# Patient Record
Sex: Male | Born: 1990
Health system: Southern US, Community
[De-identification: ages and names within clinical notes are randomized; demographics above are authoritative.]

## PROBLEM LIST (undated history)

## (undated) DIAGNOSIS — R55 Syncope and collapse: Secondary | ICD-10-CM

## (undated) DIAGNOSIS — D72819 Decreased white blood cell count, unspecified: Secondary | ICD-10-CM

## (undated) DIAGNOSIS — Z91018 Allergy to other foods: Secondary | ICD-10-CM

## (undated) DIAGNOSIS — G4700 Insomnia, unspecified: Secondary | ICD-10-CM

## (undated) DIAGNOSIS — K219 Gastro-esophageal reflux disease without esophagitis: Secondary | ICD-10-CM

## (undated) DIAGNOSIS — R790 Abnormal level of blood mineral: Secondary | ICD-10-CM

## (undated) HISTORY — DX: Allergy to other foods: Z91.018

## (undated) HISTORY — DX: Syncope and collapse: R55

## (undated) HISTORY — DX: Gastro-esophageal reflux disease without esophagitis: K21.9

## (undated) HISTORY — DX: Decreased white blood cell count, unspecified: D72.819

## (undated) HISTORY — PX: LIPOMA EXCISION: SHX5283

## (undated) HISTORY — PX: MOLE REMOVAL: SHX2046

## (undated) HISTORY — DX: Insomnia, unspecified: G47.00

## (undated) HISTORY — DX: Abnormal level of blood mineral: R79.0

---

## 2019-01-22 ENCOUNTER — Ambulatory Visit (INDEPENDENT_AMBULATORY_CARE_PROVIDER_SITE_OTHER): Payer: No Typology Code available for payment source | Admitting: Family Medicine

## 2019-01-22 ENCOUNTER — Other Ambulatory Visit: Payer: Self-pay

## 2019-01-22 ENCOUNTER — Encounter: Payer: Self-pay | Admitting: Family Medicine

## 2019-01-22 VITALS — BP 110/84 | HR 71 | Temp 98.6°F | Ht 72.0 in | Wt 158.2 lb

## 2019-01-22 DIAGNOSIS — L723 Sebaceous cyst: Secondary | ICD-10-CM

## 2019-01-22 DIAGNOSIS — R21 Rash and other nonspecific skin eruption: Secondary | ICD-10-CM | POA: Diagnosis not present

## 2019-01-22 DIAGNOSIS — D225 Melanocytic nevi of trunk: Secondary | ICD-10-CM | POA: Diagnosis not present

## 2019-01-22 DIAGNOSIS — D229 Melanocytic nevi, unspecified: Secondary | ICD-10-CM

## 2019-01-22 NOTE — Assessment & Plan Note (Signed)
Does not currently have any rash.  Possibly could be psoriasis based on his description.  Will place referral to dermatology.

## 2019-01-22 NOTE — Assessment & Plan Note (Signed)
Punch biopsy performed today.  He tolerated well-see below problem.  Will place referral to dermatology.

## 2019-01-22 NOTE — Patient Instructions (Signed)
It was very nice to see you today!  We biopsied your mole today.  I will place a referral to dermatology.  We will call you with the results once they are available.   Take care, Dr [name]   Skin Biopsy, Care After This sheet gives you information about how to care for yourself after your procedure. Your health care provider may also give you more specific instructions. If you have problems or questions, contact your health care provider. What can I expect after the procedure? After the procedure, it is common to have:  Soreness.  Bruising.  Itching. Follow these instructions at home: Biopsy site care Follow instructions from your health care provider about how to take care of your biopsy site. Make sure you:  Wash your hands with soap and water before and after you change your bandage (dressing). If soap and water are not available, use hand sanitizer.  Apply ointment on your biopsy site as directed by your health care provider.  Change your dressing as told by your health care provider.  Leave stitches (sutures), skin glue, or adhesive strips in place. These skin closures may need to stay in place for 2 weeks or longer. If adhesive strip edges start to loosen and curl up, you may trim the loose edges. Do not remove adhesive strips completely unless your health care provider tells you to do that.  If the biopsy area bleeds, apply gentle pressure for 10 minutes. Check your biopsy site every day for signs of infection. Check for:  Redness, swelling, or pain.  Fluid or blood.  Warmth.  Pus or a bad smell.  General instructions  Rest and then return to your normal activities as told by your health care provider.  Take over-the-counter and prescription medicines only as told by your health care provider.  Keep all follow-up visits as told by your health care provider. This is important. Contact a health care provider if:  You have redness, swelling, or pain around  your biopsy site.  You have fluid or blood coming from your biopsy site.  Your biopsy site feels warm to the touch.  You have pus or a bad smell coming from your biopsy site.  You have a fever.  Your sutures, skin glue, or adhesive strips loosen or come off sooner than expected. Get help right away if:  You have bleeding that does not stop with pressure or a dressing. Summary  After the procedure, it is common to have soreness, bruising, and itching at the site.  Follow instructions from your health care provider about how to take care of your biopsy site.  Check your biopsy site every day for signs of infection.  Contact a health care provider if you have redness, swelling, or pain around your biopsy site, or your biopsy site feels warm to the touch.  Keep all follow-up visits as told by your health care provider. This is important. This information is not intended to replace advice given to you by your health care provider. Make sure you discuss any questions you have with your health care provider. Document Released: 08/18/2015 Document Revised: 01/19/2018 Document Reviewed: 01/19/2018 Elsevier Interactive Patient Education  [employer].

## 2019-01-22 NOTE — Progress Notes (Signed)
Chief Complaint:  Scott Costa is a 28 y.o. male who presents today with a chief complaint of skin lesions and to establish care.   Assessment/Plan:  Sebaceous cyst Will place referral to dermatology per patient request for excision.   Rash Does not currently have any rash.  Possibly could be psoriasis based on his description.  Will place referral to dermatology.  Atypical nevus Punch biopsy performed today.  He tolerated well-see below problem.  Will place referral to dermatology.     Subjective:  HPI:  He has a few chronic problems outlined below:  Atypical Moles Patient with several history of atypical moles.  There is nonspecific mole that he is concerned about on his left flank.  He is concerned because it is irregular in pattern and has irregular color.  No bleeding or drainage from the area.  Overall lesion seems to be stable however he is concerned about possible melanoma.  Rash Also reports intermittent history of erythematous, pruritic rash.  Typically occurs on either side of his flank and then progresses in size.  They were then spontaneously resolved.  Does not currently have any symptoms of this.  Cyst Located on midline upper back.  Occasionally painful.  Occasionally drains sebum.  No fevers.  No chills.  No purulent drainage.  No treatments tried.   ROS: Per HPI, otherwise a complete review of systems was negative.   PMH:  The following were reviewed and entered/updated in epic: Past Medical History:  Diagnosis Date  . Fainting spell    Due to blood draws   Patient Active Problem List   Diagnosis Date Noted  . Atypical nevus 01/22/2019  . Rash 01/22/2019  . Sebaceous cyst 01/22/2019   Past Surgical History:  Procedure Laterality Date  . LIPOMA EXCISION      Family History  Problem Relation Age of Onset  . Hypertension Father   . Colon cancer Paternal Grandfather     Medications- reviewed and updated No current outpatient  medications on file.   No current facility-administered medications for this visit.     Allergies-reviewed and updated Allergies  Allergen Reactions  . Nickel     Social History   Socioeconomic History  . Marital status: Single    Spouse name: Not on file  . Number of children: Not on file  . Years of education: Not on file  . Highest education level: Not on file  Occupational History  . Not on file  Social Needs  . Financial resource strain: Not on file  . Food insecurity    Worry: Not on file    Inability: Not on file  . Transportation needs    Medical: Not on file    Non-medical: Not on file  Tobacco Use  . Smoking status: Former Smoker    Years: 5.00  . Smokeless tobacco: Never Used  Substance and Sexual Activity  . Alcohol use: Never    Frequency: Never  . Drug use: Not on file  . Sexual activity: Not on file  Lifestyle  . Physical activity    Days per week: Not on file    Minutes per session: Not on file  . Stress: Not on file  Relationships  . Social Herbalist on phone: Not on file    Gets together: Not on file    Attends religious service: Not on file    Active member of club or organization: Not on file    Attends meetings  of clubs or organizations: Not on file    Relationship status: Not on file  Other Topics Concern  . Not on file  Social History Narrative  . Not on file          Objective:  Physical Exam: BP 110/84 (BP Location: Left Arm, Patient Position: Sitting, Cuff Size: Normal)   Pulse 71   Temp 98.6 F (37 C) (Oral)   Ht 6' (1.829 m)   Wt 158 lb 3.2 oz (71.8 kg)   SpO2 99%   BMI 21.46 kg/m   Gen: NAD, resting comfortably CV: Regular rate and rhythm with no murmurs appreciated Pulm: Normal work of breathing, clear to auscultation bilaterally with no crackles, wheezes, or rhonchi GI: Normal bowel sounds present. Soft, Nontender, Nondistended. MSK: No edema, cyanosis, or clubbing noted Skin: Freely mobile 1 to 2 cm  mass on midline upper neck.  Several nevi noted scattered across upper extremities and torso.  Atypical appearing nevus on left flank approximately 7 mm x 2 cm with irregular border and irregular color. Neuro: Grossly normal, moves all extremities Psych: Normal affect and thought content  Punch Biopsy Procedure Note:  After informed written consent was obtained, using Betadine for cleansing and 1% Lidocaine with epinephrine for anesthetic, with sterile technique a 4 mm punch biopsy was used to obtain a biopsy specimen of the lesion. Hemostasis was obtained by pressure and wound was not sutured. Antibiotic dressing is applied, and wound care instructions provided. Be alert for any signs of cutaneous infection. The specimen is labeled and sent to pathology for evaluation. The procedure was well tolerated without complications.      Algis Greenhouse. [name], MD 01/22/2019 2:08 PM

## 2019-01-22 NOTE — Assessment & Plan Note (Signed)
Will place referral to dermatology per patient request for excision.

## 2019-01-26 NOTE — Progress Notes (Signed)
Please inform patient of the following:  Biopsy shows a dysplastic nevus with moderate atypia. Would not recommend further excision at this time, however he can follow up with dermatology as we discussed at his OV.  Algis Greenhouse. [name], MD 01/26/2019 4:23 PM

## 2019-01-27 ENCOUNTER — Telehealth: Payer: Self-pay | Admitting: Family Medicine

## 2019-01-27 NOTE — Telephone Encounter (Signed)
Result note read to patient; verbalizes understanding.  Telephone encounter as results were not routed to [facility].

## 2019-04-07 ENCOUNTER — Ambulatory Visit (INDEPENDENT_AMBULATORY_CARE_PROVIDER_SITE_OTHER): Payer: No Typology Code available for payment source | Admitting: Family Medicine

## 2019-04-07 ENCOUNTER — Encounter: Payer: Self-pay | Admitting: Family Medicine

## 2019-04-07 DIAGNOSIS — R05 Cough: Secondary | ICD-10-CM | POA: Diagnosis not present

## 2019-04-07 DIAGNOSIS — R059 Cough, unspecified: Secondary | ICD-10-CM

## 2019-04-07 MED ORDER — PANTOPRAZOLE SODIUM 40 MG PO TBEC: 40.0000 mg | DELAYED_RELEASE_TABLET | Freq: Every day | ORAL | 0 refills | Status: DC

## 2019-04-07 MED ORDER — AZELASTINE HCL 0.1 % NA SOLN: 2.0000 | Freq: Two times a day (BID) | NASAL | 12 refills | Status: DC

## 2019-04-07 MED FILL — AZELASTINE HCL 137 MCG SPRY: 0.1 | 50 days supply | Qty: 30 | Fill #0

## 2019-04-07 MED FILL — PANTOPRAZOLE SOD DR 40 MG T: 40 | 30 days supply | Qty: 30 | Fill #0

## 2019-04-07 NOTE — Progress Notes (Signed)
   Chief Complaint:  Laura Busto is a 28 y.o. male who presents today for a virtual office visit with a chief complaint of cough.   Assessment/Plan:  Cough No red flags.  Likely postinfectious.  It is possibly to be having some postnasal drip related to allergens.  Is also possible he may be having silent reflux.  Will treat with Astelin nasal spray and Protonix for the next week or so.  No signs concerning for atypical pneumonia-we will hold off on chest x-ray for the time being.  If symptoms do not improve with above or symptoms worsen patient will come into the office for chest x-ray.  Discussed reasons return to care earlier.  Follow-up as needed.    Subjective:  HPI:  Cough Started about 5 weeks ago. Cough is nonproductive.  Symptoms have not changed significantly over the last couple of weeks. No fevers or chills. Had a sick contact at work about a month ago.  No fevers or chills.  Associated with some discomfort at the bottom of his throat.  No congestion.  No rhinorrhea.  No loss of taste or smell.  Has been wearing medical mask at work and is not sure if he is having a allergic reaction to the fibers and the mask.  He is worried about possible atypical pneumonia.  No other treatments tried.  No other obvious alleviating or aggravating factors.  ROS: Per HPI  PMH: He reports that he has quit smoking. He quit after 5.00 years of use. He has never used smokeless tobacco. He reports that he does not drink alcohol. No history on file for drug.      Objective/Observations  Physical Exam: Gen: NAD, resting comfortably Pulm: Normal work of breathing Neuro: Grossly normal, moves all extremities Psych: Normal affect and thought content  No results found for this or any previous visit (from the past 24 hour(s)).   Virtual Visit via Video   I connected with Kedus Drouhard on 04/07/19 at 10:00 AM EDT by a video enabled telemedicine application and verified that I am speaking  with the correct person using two identifiers. I discussed the limitations of evaluation and management by telemedicine and the availability of in person appointments. The patient expressed understanding and agreed to proceed.   Patient location: Home Provider location: [place] participating in the virtual visit: Myself and Patient     Algis Greenhouse. [name], MD 04/07/2019 9:52 AM

## 2019-04-17 ENCOUNTER — Encounter: Payer: Self-pay | Admitting: Family Medicine

## 2019-04-27 ENCOUNTER — Other Ambulatory Visit: Payer: Self-pay | Admitting: Family Medicine

## 2019-04-28 MED ORDER — PANTOPRAZOLE SODIUM 40 MG PO TBEC: 40.0000 mg | DELAYED_RELEASE_TABLET | Freq: Every day | ORAL | 0 refills | Status: DC

## 2019-04-29 MED FILL — PANTOPRAZOLE SOD DR 40 MG T: 40 | 30 days supply | Qty: 30 | Fill #0

## 2019-05-08 ENCOUNTER — Encounter: Payer: Self-pay | Admitting: Family Medicine

## 2019-05-10 ENCOUNTER — Other Ambulatory Visit: Payer: Self-pay

## 2019-05-10 DIAGNOSIS — R1310 Dysphagia, unspecified: Secondary | ICD-10-CM

## 2019-05-10 DIAGNOSIS — K219 Gastro-esophageal reflux disease without esophagitis: Secondary | ICD-10-CM

## 2019-05-14 ENCOUNTER — Other Ambulatory Visit: Payer: Self-pay

## 2019-05-14 MED ORDER — PANTOPRAZOLE SODIUM 40 MG PO TBEC: 40.0000 mg | DELAYED_RELEASE_TABLET | Freq: Two times a day (BID) | ORAL | 0 refills | Status: DC

## 2019-05-15 MED FILL — PANTOPRAZOLE SOD DR 40 MG T: 40 | 30 days supply | Qty: 60 | Fill #0

## 2019-06-08 ENCOUNTER — Other Ambulatory Visit: Payer: Self-pay

## 2019-06-08 ENCOUNTER — Ambulatory Visit (INDEPENDENT_AMBULATORY_CARE_PROVIDER_SITE_OTHER): Payer: No Typology Code available for payment source | Admitting: Otolaryngology

## 2019-06-08 ENCOUNTER — Encounter (INDEPENDENT_AMBULATORY_CARE_PROVIDER_SITE_OTHER): Payer: Self-pay | Admitting: Otolaryngology

## 2019-06-08 VITALS — Temp 98.1°F

## 2019-06-08 DIAGNOSIS — R0989 Other specified symptoms and signs involving the circulatory and respiratory systems: Secondary | ICD-10-CM | POA: Diagnosis not present

## 2019-06-08 NOTE — Progress Notes (Signed)
HPI: Scott Costa is a 28 y.o. male who is referred by Dr. [name] for evaluation of throat irritation. Patient states that for the past three months he has dealt with intermittent lower throat irritation. He describes the irritation as a burning and scratchy sensation. It occasionally hurts when he swallows and talks but not all the time. He states it changes day to day on how severe it is. He states he does have some difficulty swallowing at times where he feels like things get stuck in his abdomen and reflux up to his throat area but he has not lost significant weight due to this. He has tried GERD treatment with Pantoprazole and strict diet without benefit. Patient denies nasal congestion, post-nasal drip, hoarseness. Patient is a previous smoker.  Past Medical History:  Diagnosis Date  . Fainting spell    Due to blood draws   Past Surgical History:  Procedure Laterality Date  . LIPOMA EXCISION     Social History   Socioeconomic History  . Marital status: Single    Spouse name: Not on file  . Number of children: Not on file  . Years of education: Not on file  . Highest education level: Not on file  Occupational History  . Not on file  Social Needs  . Financial resource strain: Not on file  . Food insecurity    Worry: Not on file    Inability: Not on file  . Transportation needs    Medical: Not on file    Non-medical: Not on file  Tobacco Use  . Smoking status: Former Smoker    Packs/day: 0.40    Years: 5.00    Pack years: 2.00    Start date: 2006    Quit date: 2014    Years since quitting: 6.8  . Smokeless tobacco: Never Used  Substance and Sexual Activity  . Alcohol use: Never    Frequency: Never  . Drug use: Not on file  . Sexual activity: Not on file  Lifestyle  . Physical activity    Days per week: Not on file    Minutes per session: Not on file  . Stress: Not on file  Relationships  . Social Herbalist on phone: Not on file    Gets together:  Not on file    Attends religious service: Not on file    Active member of club or organization: Not on file    Attends meetings of clubs or organizations: Not on file    Relationship status: Not on file  Other Topics Concern  . Not on file  Social History Narrative  . Not on file   Family History  Problem Relation Age of Onset  . Hypertension Father   . Colon cancer Paternal Grandfather    Allergies  Allergen Reactions  . Nickel    Prior to Admission medications   Medication Sig Start Date End Date Taking? Authorizing Provider  azelastine (ASTELIN) 0.1 % nasal spray Place 2 sprays into both nostrils 2 (two) times daily. 04/07/19   [name], MD  pantoprazole (PROTONIX) 40 MG tablet Take 1 tablet (40 mg total) by mouth 2 (two) times daily. 05/14/19   [name], MD     Positive ROS: negative for ear pain, nasal congestion  All other systems have been reviewed and were otherwise negative with the exception of those mentioned in the HPI and as above.  Physical Exam: General: Alert, no acute distress Ears: External  ears without lesions or tenderness. Ear canals are clear bilaterally with intact, clear TMs Nasal: External nose without deformity or lesions. Clear nasal passages. Septum midline. Middle meatus bilaterally without polyps or masses. Oral: Clear oropharynx Neck: No palpable adenopathy or masses.  Laryngoscopy  Date/Time: 06/08/2019 6:10 PM Performed by: Rozetta Nunnery, MD Authorized by: Rozetta Nunnery, MD   Consent:    Consent obtained:  Verbal   Consent given by:  Patient Procedure details:    Indications: assessment of airway     Medication:  Afrin   Instrument: flexible fiberoptic laryngoscope     Scope location: right nare   Nasal cavity:    Right inferior turbinates: normal     Left inferior turbinates: normal   Septum:    normal   Sinus:    Right middle meatus: normal     patent     Right nasopharynx: normal     patent      Right Eustachian tube orifices: normal     patent   Mouth:    Posterior pharyngeal wall: normal     Oropharynx: normal     Vallecula: normal     Base of tongue: normal     Epiglottis: normal   Throat:    Right hypopharynx: normal     Left hypopharynx: normal     Pyriform sinus: normal     False vocal cords: normal     True vocal cords: normal   Post-procedure details:    Patient tolerance of procedure:  Tolerated well, no immediate complications     Assessment: Globus sensation with normal upper airway examination. ?Etiology, possibly due to laryngeal-pharyngeal reflux  Plan: Re-assured patient regarding examination; no masses or lesions appreciated. Recommend patient switch to trial of omeprazole 40mg  before dinner x4 weeks, Rx provided. If symptoms do not improve on new medication, recommend he get evaluated by GI. He will follow up here PRN.   Christin Hoffstadt, PA-C  I have personally seen and examined this patient. I agree with the assessment and plan as outlined above. Radene Journey, MD   CC: Dimas Chyle, MD

## 2019-06-09 MED FILL — OMEPRAZOLE 40 MG CPDR: 40 | 30 days supply | Qty: 30 | Fill #0

## 2019-07-06 ENCOUNTER — Encounter: Payer: Self-pay | Admitting: Family Medicine

## 2019-07-19 ENCOUNTER — Other Ambulatory Visit: Payer: No Typology Code available for payment source

## 2019-07-19 NOTE — Telephone Encounter (Signed)
Copied from [place] [id number]. Topic: Appointment Scheduling - Scheduling Inquiry for Clinic >> Jul 19, 2019 12:43 PM Erick Blinks wrote: Reason for CRM: Pt wants to get scheduled for a chest xray. Please advise, does pt need an appt with Dr. [name] first or is it OK to schedule xray?

## 2019-07-20 ENCOUNTER — Ambulatory Visit (INDEPENDENT_AMBULATORY_CARE_PROVIDER_SITE_OTHER): Payer: No Typology Code available for payment source

## 2019-07-20 ENCOUNTER — Other Ambulatory Visit: Payer: Self-pay

## 2019-07-20 ENCOUNTER — Other Ambulatory Visit: Payer: No Typology Code available for payment source

## 2019-07-20 ENCOUNTER — Ambulatory Visit: Payer: No Typology Code available for payment source | Admitting: Family Medicine

## 2019-07-20 DIAGNOSIS — R059 Cough, unspecified: Secondary | ICD-10-CM

## 2019-07-20 DIAGNOSIS — R05 Cough: Secondary | ICD-10-CM | POA: Diagnosis not present

## 2019-07-21 ENCOUNTER — Encounter: Payer: Self-pay | Admitting: Family Medicine

## 2019-07-21 ENCOUNTER — Ambulatory Visit (INDEPENDENT_AMBULATORY_CARE_PROVIDER_SITE_OTHER): Payer: No Typology Code available for payment source | Admitting: Family Medicine

## 2019-07-21 DIAGNOSIS — R059 Cough, unspecified: Secondary | ICD-10-CM

## 2019-07-21 DIAGNOSIS — R05 Cough: Secondary | ICD-10-CM | POA: Diagnosis not present

## 2019-07-21 DIAGNOSIS — R918 Other nonspecific abnormal finding of lung field: Secondary | ICD-10-CM

## 2019-07-21 NOTE — Progress Notes (Signed)
Patient not seen - scheduled for virtual visit.  Scott Costa. [name], MD 07/21/2019 7:58 AM

## 2019-07-21 NOTE — Progress Notes (Signed)
    Chief Complaint:  Scott Costa is a 28 y.o. male who presents today for a virtual office visit with a chief complaint of cough.   Assessment/Plan:  Cough CXR yesterday with bilateral opacities. Could represent sequelae of pulmonary infection 6 months ago, however given persistence of cough, will need further work up. Will check CT lungs with contrast and place referral to pulmonology. 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     Subjective:  HPI:  Cough Established problem. Unchanged since last visit. Has seen ENT and had normal laryngoscopy. He has stopped taking protonix and astelin as they did not seem to help. Had CXR yesterday. No reported fevers or chills.    ROS: Per HPI  PMH: He reports that he quit smoking about 6 years ago. He started smoking about 14 years ago. He has a 2.00 pack-year smoking history. He has never used smokeless tobacco. He reports that he does not drink alcohol. No history on file for drug.      Objective/Observations  Physical Exam: Gen: NAD, resting comfortably Pulm: Normal work of breathing Neuro: Grossly normal, moves all extremities Psych: Normal affect and thought content   Virtual Visit via Video   I connected with Scott Costa on 07/21/19 at 11:20 AM EST by a video enabled telemedicine application and verified that I am speaking with the correct person using two identifiers. The limitations of evaluation and management by telemedicine and the availability of in person appointments were discussed. The patient expressed understanding and agreed to proceed.   Patient location: Home Provider location: [place] participating in the virtual visit: Myself and Patient     Algis Greenhouse. [name], MD 07/21/2019 11:07 AM

## 2019-07-22 ENCOUNTER — Other Ambulatory Visit: Payer: Self-pay | Admitting: Family Medicine

## 2019-07-22 DIAGNOSIS — R05 Cough: Secondary | ICD-10-CM

## 2019-07-22 DIAGNOSIS — R059 Cough, unspecified: Secondary | ICD-10-CM

## 2019-07-23 MED ORDER — DIAZEPAM 5 MG PO TABS: ORAL_TABLET | ORAL | 0 refills | Status: DC

## 2019-07-23 MED FILL — diazePAM 5 MG TABS: 5 | 1 days supply | Qty: 1 | Fill #0

## 2019-07-23 NOTE — Telephone Encounter (Signed)
Sent in 1 time dose of valium.   Algis Greenhouse. [name], MD 07/23/2019 9:12 AM

## 2019-07-27 ENCOUNTER — Telehealth: Payer: Self-pay | Admitting: Family Medicine

## 2019-07-27 NOTE — Telephone Encounter (Signed)
Notified patient that a negative Covid 19 test is needed before CT Scan he will call [place] for requirements.

## 2019-07-27 NOTE — Telephone Encounter (Signed)
See note  Copied from [place] [id number]. Topic: General - Inquiry >> Jul 27, 2019  2:22 PM Virl Axe D wrote: Reason for CRM: Wells Guiles with [facility] called to speak with Dr. Marigene Ehlers assistant regarding CT scheduled for 08/02/19. Advised to send CRM. Pt has to have negative Covid 19 test before they can do his scan. They did not see anything in his chart. Requesting CB. Please advise. [phone]

## 2019-08-02 ENCOUNTER — Inpatient Hospital Stay: Admission: RE | Admit: 2019-08-02 | Payer: No Typology Code available for payment source | Source: Ambulatory Visit

## 2019-08-09 ENCOUNTER — Encounter: Payer: Self-pay | Admitting: Family Medicine

## 2019-08-13 ENCOUNTER — Encounter: Payer: Self-pay | Admitting: Family Medicine

## 2019-08-13 ENCOUNTER — Ambulatory Visit (INDEPENDENT_AMBULATORY_CARE_PROVIDER_SITE_OTHER): Payer: No Typology Code available for payment source | Admitting: Family Medicine

## 2019-08-13 DIAGNOSIS — R05 Cough: Secondary | ICD-10-CM

## 2019-08-13 DIAGNOSIS — R059 Cough, unspecified: Secondary | ICD-10-CM

## 2019-08-13 MED ORDER — BUDESONIDE-FORMOTEROL FUMARATE 80-4.5 MCG/ACT IN AERO: 2.0000 | INHALATION_SPRAY | Freq: Two times a day (BID) | RESPIRATORY_TRACT | 12 refills | Status: DC

## 2019-08-13 MED ORDER — PREDNISONE 50 MG PO TABS: ORAL_TABLET | ORAL | 0 refills | Status: DC

## 2019-08-13 MED FILL — SYMBICORT 80-4.5 MCG INH: 80-4.5 | 30 days supply | Qty: 10 | Fill #0

## 2019-08-13 MED FILL — predniSONE 50 MG TABS: 50 | 5 days supply | Qty: 5 | Fill #0

## 2019-08-13 NOTE — Progress Notes (Signed)
   Scott Costa is a 29 y.o. male who presents today for a virtual office visit.  Assessment/Plan:  Chronic Problems Addressed Today: Cough Chronic cough.  Worsened recently due to Covid infection.  Had to postpone his CT scan.  His systemic symptoms seem to be improving however cough is worsening.  We will start Symbicort and course of prednisone.  Hopefully will be able to get CT scan soon. May need referral to pulm depending on CT scan and if symptoms do not improve with above.      Subjective:  HPI:  Cough Patient last seen about a month ago.  Was found to have chest x-ray with bilateral opacities.  He was scheduled to have a CT scan done of his lungs however unfortunately contracted Covid in the interim about 10 days ago.   Was recently on a flight to [place] and sat next to someone who was ill. Thinks that he contracted covid from that person. He had a rough initial 2-3 days with fevers and rhinorrhea, but symptoms have been gradually improving since then. His cough has worsened over the last few days.  He is worried about Covid worsening the inflammation in his lungs.       Objective/Observations  Physical Exam: Gen: NAD, resting comfortably Pulm: Normal work of breathing Neuro: Grossly normal, moves all extremities Psych: Normal affect and thought content  Virtual Visit via Video   I connected with Scott Costa on 08/13/19 at  4:00 PM EST by a video enabled telemedicine application and verified that I am speaking with the correct person using two identifiers. The limitations of evaluation and management by telemedicine and the availability of in person appointments were discussed. The patient expressed understanding and agreed to proceed.   Patient location: Home Provider location: [place] participating in the virtual visit: Myself and Patient     Algis Greenhouse. [name], MD 08/13/2019 8:58 AM

## 2019-08-13 NOTE — Assessment & Plan Note (Signed)
Chronic cough.  Worsened recently due to Covid infection.  Had to postpone his CT scan.  His systemic symptoms seem to be improving however cough is worsening.  We will start Symbicort and course of prednisone.  Hopefully will be able to get CT scan soon. May need referral to pulm depending on CT scan and if symptoms do not improve with above.

## 2019-08-18 ENCOUNTER — Ambulatory Visit
Admission: RE | Admit: 2019-08-18 | Discharge: 2019-08-18 | Disposition: A | Discharge status: Home / Self Care | Payer: No Typology Code available for payment source | Source: Ambulatory Visit | Attending: Family Medicine | Admitting: Family Medicine

## 2019-08-18 ENCOUNTER — Other Ambulatory Visit: Payer: Self-pay

## 2019-08-18 DIAGNOSIS — R05 Cough: Secondary | ICD-10-CM

## 2019-08-18 DIAGNOSIS — R059 Cough, unspecified: Secondary | ICD-10-CM

## 2019-08-18 MED ORDER — IOPAMIDOL (ISOVUE-300) INJECTION 61%
75.0000 mL | Freq: Once | INTRAVENOUS | Status: AC | PRN
  Administered 2019-08-18: 75 mL via INTRAVENOUS

## 2019-08-19 NOTE — Progress Notes (Signed)
Please inform patient of the following:  Chest CT showed tiny nodules consistent with inflammation as we discussed. Do not need to change treatment plan at this time. Would like for him to continue inhaler and follow up with pulmonology.

## 2019-08-23 ENCOUNTER — Encounter: Payer: Self-pay | Admitting: Family Medicine

## 2019-08-24 ENCOUNTER — Other Ambulatory Visit: Payer: Self-pay

## 2019-08-24 DIAGNOSIS — R918 Other nonspecific abnormal finding of lung field: Secondary | ICD-10-CM

## 2019-10-11 ENCOUNTER — Ambulatory Visit (INDEPENDENT_AMBULATORY_CARE_PROVIDER_SITE_OTHER): Payer: No Typology Code available for payment source | Admitting: Family Medicine

## 2019-10-11 DIAGNOSIS — G4700 Insomnia, unspecified: Secondary | ICD-10-CM

## 2019-10-11 DIAGNOSIS — K219 Gastro-esophageal reflux disease without esophagitis: Secondary | ICD-10-CM

## 2019-10-11 MED ORDER — GABAPENTIN 300 MG PO CAPS: 300.0000 mg | ORAL_CAPSULE | Freq: Every day | ORAL | 3 refills | Status: DC

## 2019-10-11 MED FILL — GABAPENTIN 300 MG CAPSULE: 300 | 90 days supply | Qty: 90 | Fill #0

## 2019-10-11 NOTE — Assessment & Plan Note (Signed)
Uncontrolled. Continue PPI.  Will place referral to GI per request.

## 2019-10-11 NOTE — Assessment & Plan Note (Addendum)
Uncontrolled. Has concurrent restless legs as well.  Is already working on sleep hygiene.  Discussed treatment options.  He would like to avoid trazodone.  Will start gabapentin which should hopefully help with restless legs as well.  Start 300 mg nightly.  Will titrate dose as needed.  He will follow-up with me in a couple weeks via MyChart.

## 2019-10-11 NOTE — Progress Notes (Signed)
   Scott Costa is a 29 y.o. male who presents today for a virtual office visit.  Assessment/Plan:  Chronic Problems Addressed Today: Insomnia Uncontrolled. Has concurrent restless legs as well.  Is already working on sleep hygiene.  Discussed treatment options.  He would like to avoid trazodone.  Will start gabapentin which should hopefully help with restless legs as well.  Start 300 mg nightly.  Will titrate dose as needed.  He will follow-up with me in a couple weeks via MyChart.  Gastroesophageal reflux disease Uncontrolled. Continue PPI.  Will place referral to GI per request.    Subjective:  HPI:  Patient is still having issues with reflux.  He has been on omeprazole for the past 3 months however still has persistent reflux.  He would like to be referred to GI.  He also has several year history of insomnia.  He has managed in the past with sleep hygiene and melatonin but neither of these measures were particularly effective.  Sometimes has a sensation like he has to move all his limbs which also prevents him from sleeping.  Will frequently get only 4 hours of sleep or so per night.       Objective/Observations  Physical Exam: Gen: NAD, resting comfortably Pulm: Normal work of breathing Neuro: Grossly normal, moves all extremities Psych: Normal affect and thought content  Virtual Visit via Video   I connected with Scott Costa on 10/11/19 at 11:00 AM EST by a video enabled telemedicine application and verified that I am speaking with the correct person using two identifiers. The limitations of evaluation and management by telemedicine and the availability of in person appointments were discussed. The patient expressed understanding and agreed to proceed.   Patient location: Home Provider location: [place] participating in the virtual visit: Myself and Patient     Algis Greenhouse. [name], MD 10/11/2019 11:29 AM

## 2019-10-27 ENCOUNTER — Telehealth: Payer: Self-pay

## 2019-10-27 NOTE — Telephone Encounter (Signed)
Patient states that he did have the flu vaccine because he work with [employer] but he's unable to talk right because he's at work

## 2019-10-27 NOTE — Telephone Encounter (Signed)
Noted  

## 2019-11-15 ENCOUNTER — Encounter: Payer: Self-pay | Admitting: Gastroenterology

## 2019-11-15 ENCOUNTER — Telehealth: Payer: Self-pay

## 2019-11-15 ENCOUNTER — Ambulatory Visit (INDEPENDENT_AMBULATORY_CARE_PROVIDER_SITE_OTHER): Payer: No Typology Code available for payment source | Admitting: Gastroenterology

## 2019-11-15 VITALS — BP 112/80 | HR 84 | Temp 98.4°F | Ht 72.0 in | Wt 157.2 lb

## 2019-11-15 DIAGNOSIS — R12 Heartburn: Secondary | ICD-10-CM | POA: Diagnosis not present

## 2019-11-15 DIAGNOSIS — R070 Pain in throat: Secondary | ICD-10-CM

## 2019-11-15 NOTE — Progress Notes (Addendum)
[facility] Consult Note:  History: Scott Costa 11/15/2019  Referring provider: [name], MD  Reason for consult/chief complaint: Cough, esophageal pain (intermittent), odynophagia (intermittent with liquid and solids), and Nausea (intermittent)   Subjective  HPI:  This is a very pleasant 29 year old man referred by primary care for throat pain and cough with question of GERD as cause.  Last August he began having frequent burning throat discomfort with a feeling of fullness and sometimes coughs while eating.  Certain foods might seem to trigger mucus production.  He had been bothered by allergies and has taken Zyrtec and azelastine spray without much improvement.  Reports seeing ENT who did fiberoptic exam, said it was normal and that symptoms may be LPR.  He was initially on once daily pantoprazole, then increase to twice daily with no improvement in symptoms.  He therefore stopped that medication with no change afterwards. Sometimes there is a pain in the throat with solid foods or liquids.  Most recent primary care telemedicine note from 10/11/2019 was reviewed. ROS:  Review of Systems  Constitutional: Negative for appetite change and unexpected weight change.  HENT: Negative for mouth sores and voice change.   Eyes: Negative for pain and redness.  Respiratory: Negative for cough and shortness of breath.   Cardiovascular: Negative for chest pain and palpitations.  Genitourinary: Negative for dysuria and hematuria.  Musculoskeletal: Negative for arthralgias and myalgias.  Skin: Positive for rash. Negative for pallor.       For years he will have intermittent small red itchy spots on his trunk that will resolve quickly.  Allergic/Immunologic: Positive for environmental allergies.       No hives or episodes of tongue/lip swelling or anaphylaxis  Neurological: Negative for weakness and headaches.  Hematological: Negative for adenopathy.      Past Medical History: Past Medical History:  Diagnosis Date  . Fainting spell    Due to blood draws  . GERD (gastroesophageal reflux disease)   . Insomnia      Past Surgical History: Past Surgical History:  Procedure Laterality Date  . LIPOMA EXCISION    . MOLE REMOVAL       Family History: Family History  Problem Relation Age of Onset  . Hypertension Father   . Colon cancer Paternal Grandfather 15    Social History: Social History   Socioeconomic History  . Marital status: Single    Spouse name: Not on file  . Number of children: 0  . Years of education: Not on file  . Highest education level: Not on file  Occupational History  . Occupation: CMA  Tobacco Use  . Smoking status: Former Smoker    Packs/day: 0.40    Years: 5.00    Pack years: 2.00    Start date: 2006    Quit date: 2014    Years since quitting: 7.2  . Smokeless tobacco: Never Used  Substance and Sexual Activity  . Alcohol use: Yes    Comment: occasional  . Drug use: Never  . Sexual activity: Not on file  Other Topics Concern  . Not on file  Social History Narrative  . Not on file   Social Determinants of Health   Financial Resource Strain:   . Difficulty of Paying Living Expenses:   Food Insecurity:   . Worried About Charity fundraiser in the Last Year:   . Arboriculturist in the Last Year:   Transportation Needs:   . Lack  of Transportation (Medical):   Marland Kitchen Lack of Transportation (Non-Medical):   Physical Activity:   . Days of Exercise per Week:   . Minutes of Exercise per Session:   Stress:   . Feeling of Stress :   Social Connections:   . Frequency of Communication with Friends and Family:   . Frequency of Social Gatherings with Friends and Family:   . Attends Religious Services:   . Active Member of Clubs or Organizations:   . Attends Archivist Meetings:   Marland Kitchen Marital Status:    CMA at [employer]  Allergies: Allergies  Allergen Reactions  .  Nickel     Outpatient Meds: Current Outpatient Medications  Medication Sig Dispense Refill  . gabapentin (NEURONTIN) 300 MG capsule Take 1 capsule (300 mg total) by mouth at bedtime. 90 capsule 3   No current facility-administered medications for this visit.   Recent primary care note indicates patient had been on omeprazole for about 3 months.   ___________________________________________________________________ Objective   Exam:  BP 112/80 (BP Location: Left Arm, Patient Position: Sitting, Cuff Size: Normal)   Pulse 84   Temp 98.4 F (36.9 C)   Ht 6' (1.829 m) Comment: height measured without shoes  Wt 157 lb 4 oz (71.3 kg)   BMI 21.33 kg/m    General: Well-appearing, normal vocal quality  Eyes: sclera anicteric, no redness  ENT: oral mucosa moist without lesions, no cervical or supraclavicular lymphadenopathy  CV: RRR without murmur, S1/S2, no JVD, no peripheral edema  Resp: clear to auscultation bilaterally, normal RR and effort noted  GI: soft, no tenderness, with active bowel sounds. No guarding or palpable organomegaly noted.  Skin; warm and dry, no rash or jaundice noted  Neuro: awake, alert and oriented x 3. Normal gross motor function and fluent speech  Labs:  No data for review  Assessment: Encounter Diagnoses  Name Primary?  . Throat pain Yes  . Heartburn     Burning discomfort in the sternal notch with throat discomfort mucus production in the setting of chronic allergic rhinitis and postnasal drip.  No improvement in symptoms on high-dose PPI.  Difficult to tell if he has frank dysphagia, or whether it is a manifestation of the throat discomfort. He is still wondering if there is the possibility of GERD, whether he could have erosive esophagitis as suggested by ENT.  I am more suspicious of allergic upper airway symptoms than I am of GERD, but agree it is still a persistent question.  Consider eosinophilic esophagitis  Plan:  Upper  endoscopy.  He is agreeable after discussion of procedure and risks.  The benefits and risks of the planned procedure were described in detail with the patient or (when appropriate) their health care proxy.  Risks were outlined as including, but not limited to, bleeding, infection, perforation, adverse medication reaction leading to cardiac or pulmonary decompensation, pancreatitis (if ERCP).  The limitation of incomplete mucosal visualization was also discussed.  No guarantees or warranties were given.  Referral to Dr. Salvatore Marvel of allergy/immunology  Thank you for the courtesy of this consult.  Please call me with any questions or concerns.  Nelida Meuse III  CC: Referring provider noted above

## 2019-11-15 NOTE — Telephone Encounter (Signed)
amb referral sent to Allergy/ immunology Dr Salvatore Marvel will await appointment info.

## 2019-11-15 NOTE — Patient Instructions (Addendum)
If you are age 31 or older, your body mass index should be between 23-30. Your Body mass index is 21.33 kg/m. If this is out of the aforementioned range listed, please consider follow up with your Primary Care Provider.  If you are age 45 or younger, your body mass index should be between 19-25. Your Body mass index is 21.33 kg/m. If this is out of the aformentioned range listed, please consider follow up with your Primary Care Provider.   You have been scheduled for an endoscopy. Please follow written instructions given to you at your visit today. If you use inhalers (even only as needed), please bring them with you on the day of your procedure.  We will send your records to Dr Ernst Bowler with Allergy/Immunology. They will reach out to you to schedule.   [place], [place] [address] [phone]   It was a pleasure to see you today!  Dr. Loletha Carrow

## 2019-11-17 ENCOUNTER — Encounter: Payer: Self-pay | Admitting: Family Medicine

## 2019-11-17 DIAGNOSIS — G4700 Insomnia, unspecified: Secondary | ICD-10-CM

## 2019-11-18 MED ORDER — HYDROXYZINE HCL 50 MG PO TABS: ORAL_TABLET | ORAL | 0 refills | Status: DC

## 2019-11-18 MED FILL — hydrOXYzine HCL 50 MG TABS: 50 | 30 days supply | Qty: 30 | Fill #0

## 2019-11-18 NOTE — Telephone Encounter (Signed)
Please advise 

## 2019-11-30 NOTE — Telephone Encounter (Signed)
Patient contacted. He states he is waiting to schedule until his EGD has been completed.

## 2019-12-02 ENCOUNTER — Other Ambulatory Visit: Payer: Self-pay | Admitting: Gastroenterology

## 2019-12-02 ENCOUNTER — Ambulatory Visit (INDEPENDENT_AMBULATORY_CARE_PROVIDER_SITE_OTHER): Payer: No Typology Code available for payment source

## 2019-12-02 DIAGNOSIS — Z1159 Encounter for screening for other viral diseases: Secondary | ICD-10-CM

## 2019-12-02 LAB — SARS CORONAVIRUS 2 (TAT 6-24 HRS): SARS Coronavirus 2: NEGATIVE

## 2019-12-03 NOTE — Telephone Encounter (Signed)
Pt has an appt on 12-28-2019

## 2019-12-06 ENCOUNTER — Ambulatory Visit (AMBULATORY_SURGERY_CENTER): Payer: No Typology Code available for payment source | Admitting: Gastroenterology

## 2019-12-06 ENCOUNTER — Other Ambulatory Visit: Payer: Self-pay

## 2019-12-06 ENCOUNTER — Encounter: Payer: Self-pay | Admitting: Gastroenterology

## 2019-12-06 VITALS — BP 95/65 | HR 57 | Temp 96.8°F | Resp 11

## 2019-12-06 DIAGNOSIS — R12 Heartburn: Secondary | ICD-10-CM

## 2019-12-06 DIAGNOSIS — R070 Pain in throat: Secondary | ICD-10-CM

## 2019-12-06 HISTORY — PX: UPPER GI ENDOSCOPY: SHX6162

## 2019-12-06 MED ORDER — SODIUM CHLORIDE 0.9 % IV SOLN: 500.0000 mL | Freq: Once | INTRAVENOUS | Status: DC

## 2019-12-06 NOTE — Progress Notes (Signed)
Temp by JB. VS by CW.

## 2019-12-06 NOTE — Patient Instructions (Signed)
You may resume your previous diet and medication schedule.  Thank you for allowing [name] to care for you today!!!   YOU HAD AN ENDOSCOPIC PROCEDURE TODAY AT [facility]:   Refer to the procedure report that was given to you for any specific questions about what was found during the examination.  If the procedure report does not answer your questions, please call your gastroenterologist to clarify.  If you requested that your care partner not be given the details of your procedure findings, then the procedure report has been included in a sealed envelope for you to review at your convenience later.  YOU SHOULD EXPECT: Some feelings of bloating in the abdomen. Passage of more gas than usual.  Walking can help get rid of the air that was put into your GI tract during the procedure and reduce the bloating.  Please Note:  You might notice some irritation and congestion in your nose or some drainage.  This is from the oxygen used during your procedure.  There is no need for concern and it should clear up in a day or so.  SYMPTOMS TO REPORT IMMEDIATELY:   Following upper endoscopy (EGD)  Vomiting of blood or coffee ground material  New chest pain or pain under the shoulder blades  Painful or persistently difficult swallowing  New shortness of breath  Fever of 100F or higher  Black, tarry-looking stools  For urgent or emergent issues, a gastroenterologist can be reached at any hour by calling [phone]. Do not use MyChart messaging for urgent concerns.    DIET:  We do recommend a small meal at first, but then you may proceed to your regular diet.  Drink plenty of fluids but you should avoid alcoholic beverages for 24 hours.  ACTIVITY:  You should plan to take it easy for the rest of today and you should NOT DRIVE or use heavy machinery until tomorrow (because of the sedation medicines used during the test).    FOLLOW UP: Our staff will call the number listed on your records  48-72 hours following your procedure to check on you and address any questions or concerns that you may have regarding the information given to you following your procedure. If we do not reach you, we will leave a message.  We will attempt to reach you two times.  During this call, we will ask if you have developed any symptoms of COVID 19. If you develop any symptoms (ie: fever, flu-like symptoms, shortness of breath, cough etc.) before then, please call [phone].  If you test positive for Covid 19 in the 2 weeks post procedure, please call and report this information to [name].    If any biopsies were taken you will be contacted by phone or by letter within the next 1-3 weeks.  Please call us at [phone] if you have not heard about the biopsies in 3 weeks.    SIGNATURES/CONFIDENTIALITY: You and/or your care partner have signed paperwork which will be entered into your electronic medical record.  These signatures attest to the fact that that the information above on your After Visit Summary has been reviewed and is understood.  Full responsibility of the confidentiality of this discharge information lies with you and/or your care-partner.

## 2019-12-06 NOTE — Progress Notes (Signed)
To PACU, VSS. Report to [email or web address] 

## 2019-12-06 NOTE — Op Note (Signed)
[facility] Patient Name: Scott Costa Procedure Date: 12/06/2019 10:13 AM MRN: [id number] Endoscopist: Mallie Mussel L. Loletha Carrow , MD Age: 29 Referring MD:  Date of Birth: 07/05/1991 Gender: Male Account #: [id number] Procedure:                Upper GI endoscopy Indications:              Heartburn (burning in throat), Throat pain/globus                            (? GERD related to symptoms - see recent office                            consult note for details) Medicines:                Monitored Anesthesia Care Procedure:                Pre-Anesthesia Assessment:                           - Prior to the procedure, a History and Physical                            was performed, and patient medications and                            allergies were reviewed. The patient's tolerance of                            previous anesthesia was also reviewed. The risks                            and benefits of the procedure and the sedation                            options and risks were discussed with the patient.                            All questions were answered, and informed consent                            was obtained. Prior Anticoagulants: The patient has                            taken no previous anticoagulant or antiplatelet                            agents. ASA Grade Assessment: I - A normal, healthy                            patient. After reviewing the risks and benefits,                            the patient was deemed in satisfactory condition to  undergo the procedure.                           After obtaining informed consent, the endoscope was                            passed under direct vision. Throughout the                            procedure, the patient's blood pressure, pulse, and                            oxygen saturations were monitored continuously. The                            Endoscope was introduced through the mouth,  and                            advanced to the second part of duodenum. The upper                            GI endoscopy was accomplished without difficulty.                            The patient tolerated the procedure well. Scope In: Scope Out: Findings:                 The larynx was normal (soft tissue visualized                            better than vocal cords).                           Normal mucosa was found in the entire esophagus.                           There is no endoscopic evidence of esophagitis or                            stricture in the entire esophagus.                           The stomach was normal.                           The cardia and gastric fundus were normal on                            retroflexion.                           The examined duodenum was normal. Complications:            No immediate complications. Estimated Blood Loss:     Estimated blood loss: none. Impression:               - Normal larynx.                           -  Normal mucosa was found in the entire esophagus.                           - Normal stomach.                           - Normal examined duodenum.                           - No specimens collected. Recommendation:           - Patient has a contact number available for                            emergencies. The signs and symptoms of potential                            delayed complications were discussed with the                            patient. Return to normal activities tomorrow.                            Written discharge instructions were provided to the                            patient.                           - Resume previous diet.                           - Continue present medications.                           - Allergist consultation as scheduled. (Patient has                            had ENT evaluation) Holman Bonsignore L. Loletha Carrow, MD 12/06/2019 10:41:02 AM This report has been signed electronically.

## 2019-12-08 ENCOUNTER — Telehealth: Payer: Self-pay | Admitting: *Deleted

## 2019-12-08 NOTE — Telephone Encounter (Signed)
1. Have you developed a fever since your procedure? no  2.   Have you had an respiratory symptoms (SOB or cough) since your procedure? no  3.   Have you tested positive for COVID 19 since your procedure no  4.   Have you had any family members/close contacts diagnosed with the COVID 19 since your procedure?  no   If yes to any of these questions please route to Joylene John, RN and Erenest Rasher, RN Follow up Call-  Call back number 12/06/2019  Post procedure Call Back phone  # [phone]  Permission to leave phone message Yes     Patient questions:  Do you have a fever, pain , or abdominal swelling? No. Pain Score  0 *  Have you tolerated food without any problems? Yes.    Have you been able to return to your normal activities? Yes.    Do you have any questions about your discharge instructions: Diet   No. Medications  No. Follow up visit  No.  Do you have questions or concerns about your Care? No.  Actions: * If pain score is 4 or above: No action needed, pain <4.

## 2019-12-26 ENCOUNTER — Encounter: Payer: Self-pay | Admitting: Family Medicine

## 2019-12-27 NOTE — Telephone Encounter (Signed)
Please advise 

## 2019-12-27 NOTE — Telephone Encounter (Signed)
Pt agree with Rx Rozerem

## 2019-12-28 ENCOUNTER — Other Ambulatory Visit: Payer: Self-pay

## 2019-12-28 ENCOUNTER — Telehealth: Payer: Self-pay | Admitting: *Deleted

## 2019-12-28 ENCOUNTER — Ambulatory Visit (INDEPENDENT_AMBULATORY_CARE_PROVIDER_SITE_OTHER): Payer: No Typology Code available for payment source | Admitting: Pediatrics

## 2019-12-28 ENCOUNTER — Other Ambulatory Visit: Payer: Self-pay | Admitting: Pediatrics

## 2019-12-28 ENCOUNTER — Encounter: Payer: Self-pay | Admitting: Pediatrics

## 2019-12-28 VITALS — BP 112/68 | HR 90 | Temp 98.5°F | Resp 16 | Ht 72.5 in | Wt 162.0 lb

## 2019-12-28 DIAGNOSIS — L230 Allergic contact dermatitis due to metals: Secondary | ICD-10-CM

## 2019-12-28 DIAGNOSIS — T7800XD Anaphylactic reaction due to unspecified food, subsequent encounter: Secondary | ICD-10-CM

## 2019-12-28 DIAGNOSIS — Z9189 Other specified personal risk factors, not elsewhere classified: Secondary | ICD-10-CM

## 2019-12-28 DIAGNOSIS — R05 Cough: Secondary | ICD-10-CM

## 2019-12-28 DIAGNOSIS — J4520 Mild intermittent asthma, uncomplicated: Secondary | ICD-10-CM

## 2019-12-28 DIAGNOSIS — R059 Cough, unspecified: Secondary | ICD-10-CM

## 2019-12-28 DIAGNOSIS — J301 Allergic rhinitis due to pollen: Secondary | ICD-10-CM | POA: Diagnosis not present

## 2019-12-28 DIAGNOSIS — L858 Other specified epidermal thickening: Secondary | ICD-10-CM

## 2019-12-28 MED ORDER — MONTELUKAST SODIUM 10 MG PO TABS: ORAL_TABLET | ORAL | 5 refills | Status: DC

## 2019-12-28 MED ORDER — AZITHROMYCIN 250 MG PO TABS
ORAL_TABLET | ORAL | 0 refills | Status: DC
Start: 2019-12-28 — End: 2020-03-06

## 2019-12-28 MED ORDER — TRIAMCINOLONE ACETONIDE 0.1 % EX CREA: TOPICAL_CREAM | CUTANEOUS | 3 refills | Status: AC

## 2019-12-28 MED ORDER — FLUTICASONE PROPIONATE 50 MCG/ACT NA SUSP: 2.0000 | Freq: Every day | NASAL | 5 refills | Status: AC | PRN

## 2019-12-28 MED ORDER — RAMELTEON 8 MG PO TABS: 8.0000 mg | ORAL_TABLET | Freq: Every day | ORAL | 5 refills | Status: DC

## 2019-12-28 MED ORDER — ALBUTEROL SULFATE HFA 108 (90 BASE) MCG/ACT IN AERS
2.0000 | INHALATION_SPRAY | RESPIRATORY_TRACT | 2 refills | Status: DC | PRN
Start: 2019-12-28 — End: 2020-03-06

## 2019-12-28 MED FILL — FLUTICASONE PROP 50 MCG SPR: 50 | 30 days supply | Qty: 16 | Fill #0

## 2019-12-28 MED FILL — MONTELUKAST SOD 10 MG TAB: 10 | 30 days supply | Qty: 30 | Fill #0

## 2019-12-28 MED FILL — TRIAMCINOLONE ACETONIDE 0.1: 0.1 | 30 days supply | Qty: 45 | Fill #0

## 2019-12-28 NOTE — Patient Instructions (Addendum)
Environmental control of dust mite Zyrtec 10 mg-take 1 tablet once a day if needed for runny nose or itchy eyes Fluticasone 2 sprays per nostril once a day if needed for stuffy nose Visine AC-1 drop 3 times a day if needed for itchy eyes  Montelukast 10 mg-take 1 tablet once a day to prevent coughing or wheezing Albuterol HFA-2 puffs every 4 hours if needed for coughing or wheezing.  You may use albuterol 2 puffs 5 to 15 minutes before exercise .  Let us know if it affects your history of PVCs Add prednisone 10 mg twice a day for 4 days, 10 mg on the fifth day to bring your allergic symptoms under control  If your symptoms are not improved, I recommend adding Zithromax 250 mg-take 2 tablets on day 1 then 1 tablet once a day for the next 4 days.  The mucus at times tastes  acidic and he may have damaged areas of his epithelium following the COVID-19 infection  Triamcinolone 0.1% cream twice a day if needed to red itchy areas below the face  You should have a sleep study because of your low pulse ox measurements when you wake up, and your history of stopping breathing at night.  We will schedule a sleep study and have [facility]   interpret  the study.  I will call you with the results of your blood test for shellfish allergy .  With your blood test we will include a CBC with differential and a sedimentation rate to screen for inflammation.  Avoid shellfish until you hear from me .  Ask your parents if you had  [facility] fever growing up .  This would have been a bronchitis though some patients have very mild disease.  This could explain the tiny nodules you have in your chest.  No treatment necessary   Call us if you are not doing well on this treatment plan

## 2019-12-28 NOTE — Telephone Encounter (Signed)
I have placed a referral to [facility] for scheduling.  Their office can do the sleep consult & study, the reading and CPAP management if needed.  They will review the chart and call the patient for scheduling.   Thanks

## 2019-12-28 NOTE — Telephone Encounter (Signed)
Dee, Pt was see today. Dr. Shaune Leeks would like to refer pt for a sleep study due to his low oxygen level at night and history of his breathing stopping at night. He could go to the [facility] sleep clinic at [facility] on [address]. Pt could schedule either on Sundays or Mondays. Could you please get an appt set up for him. Thank you.

## 2019-12-28 NOTE — Progress Notes (Signed)
Scott Costa Dept: [phone]  New Patient Note  Patient ID: Scott Costa, male    DOB: Feb 13, 1991  Age: 29 y.o. MRN: [id number] Date of Office Visit: 12/28/2019 Referring provider: [name], MD [id number] [place],  [place] [address]    Chief Complaint: Sore Throat, Cough (since August off and on dry unproductive cough), and Rash (itchy rash on stomach off and on )  HPI Scott Costa presents for an allergy evaluation.  He has had a cough since August 2020.  In December 2020 he had COVID-19.` A chest x-ray done on December 16 showed heterogeneous air space opacity bilaterally.  A chest CT done on August 18, 2019 showed clear lungs with tiny pulmonary nodules in the left upper lobe, right upper lobe and right lower lobe.  Otherwise the lungs were normal. When he coughs , he feels that there is a rattling sensation with mucus production.  He does not bring up any mucus to look at.  Recently he has noted some shortness of breath when he exercises but his pulse ox is over 93% after exercising and rises to 96 to 97%.  He has not had pneumonia.  He was born in the  [facility] where coccidiomycosis is common.  The small nodules in the chest CT may be related and the radiologist felt that small nodules were insignificant.   As part of his evaluation for the cough he has had high-dose prednisone for 5 days with no relief, 3 months of PPIs with no relief, He had a normal upper endoscopy.  He had a trial with Symbicort which actually made the cough  worse.  He has a history of occasional PVCs.  The  PVCs did not get worse with the use of Symbicort or with prednisone.  He has a history of seasonal allergic rhinitis since childhood..  He has aggravation of his symptoms on exposure to excessive amounts of dust and pollens.  At times he has a postnasal drainage.  He has not had sinus infections.  He gives a history of snoring and possibly stopping  breathing in the middle of the night.  His pulse ox in the morning after he wakes up is 83%  For 20  years he has had an itchy rash which comes and goes involving his flanks.  He has a contact allergy to nickel.  The rash can last several days.  He has had dry skin for several years.  He has a history of one episode of throat swelling from shellfish.  He did not have any other allergic symptoms.  He has not eaten shellfish since then.  Review of Systems  Constitutional: Negative.   HENT:       Seasonal allergic rhinitis since childhood .  Severe snoring and sometimes he stops breathing while asleep  Eyes:       Itchy eyes at times  Respiratory:       Cough since August [id number] complicated by [id number] in December 2020  Cardiovascular:       History of occasional PVCs but tolerated Symbicort and prednisone without any adverse effects  Gastrointestinal: Negative.   Genitourinary: Negative.   Musculoskeletal: Negative.   Skin:       Itchy rash in his flanks since his 91s.  Dry skin since childhood..  Nickel allergy  Neurological: Negative.   Endo/Heme/Allergies:       No diabetes or thyroid disease  Psychiatric/Behavioral: Negative.  Outpatient Encounter Medications as of 12/28/2019  Medication Sig  . gabapentin (NEURONTIN) 300 MG capsule Take 1 capsule (300 mg total) by mouth at bedtime.  Marland Kitchen albuterol (PROAIR HFA) 108 (90 Base) MCG/ACT inhaler Inhale 2 puffs into the lungs every 4 (four) hours as needed for wheezing or shortness of breath.  Marland Kitchen azithromycin (ZITHROMAX) 250 MG tablet Take 2 tablets on day 1, then 1 tablet once a day for the next 4 days  . cetirizine (ZYRTEC) 10 MG tablet Take 10 mg by mouth daily.  . fluticasone (FLONASE) 50 MCG/ACT nasal spray Place 2 sprays into both nostrils daily as needed (for stuffy nose).  . montelukast (SINGULAIR) 10 MG tablet Take 1 tablet once a day to prevent coughing or wheezing  . ramelteon (ROZEREM) 8 MG tablet Take 1 tablet (8 mg total) by  mouth at bedtime. (Patient not taking: Reported on 12/28/2019)  . triamcinolone cream (KENALOG) 0.1 % Use twice a day if needed to red itchy areas below the face   No facility-administered encounter medications on file as of 12/28/2019.     Drug Allergies:  Allergies  Allergen Reactions  . Nickel     Family History: [name]'s family history includes Allergic rhinitis in his mother; Colon cancer (age of onset: 46) in his paternal grandfather; Emphysema in his paternal grandmother; Hypertension in his father..  Family history is positive anaphylaxis to insect stings.  Family history is negative for asthma, sinus problems, angioedema, eczema, hives, food allergies.  He has a snake, a gecko and a hermit crab at home.  He is not exposed to cigarette smoking.  He smoked cigarettes occasionally from age [age] when he quit smoking cigarettes.  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  Constitutional:      Appearance: He is well-developed and normal weight.  HENT:     Head:     Comments: Eyes normal.  Ears normal.  Nose mild swelling of the nasal turbinates with a clear nasal discharge.  Pharynx normal except for prominent posterior pharyngeal tissue Neck:     Comments: No thyromegaly Cardiovascular:     Rate and Rhythm: Normal rate and regular rhythm.     Comments: S1-S2 normal no murmurs Abdominal:     Palpations: Abdomen is soft.     Tenderness: There is no abdominal tenderness.     Comments: No hepatosplenomegaly  Musculoskeletal:     Cervical back: Neck supple.  Lymphadenopathy:     Cervical: No cervical adenopathy.  Skin:    Comments: Clear but he had keratosis pilaris of his arms   Neurological:     General: No focal deficit present.     Mental Status: He is alert and oriented to person, place, and time.  Psychiatric:        Mood and Affect: Mood normal.        Behavior: Behavior normal.     Diagnostics: Allergy skin test were positive to grass pollens, weed pollens , tree pollens, dust mite, cockroach.  There was mild reactivity to cat..  Negative skin testing to a variety of shellfish  Forced vital capacity 6.70 L FEV1 5.37 L.  Predicted FVC 5.96 L predicted FEV1 4.83 L.  After albuterol 2 puffs FVC 6.85 L FEV1 5.62 L.  The spirometry is in the normal range and there was no significant improvement after albuterol   Assessment  Assessment and Plan: 1. Anaphylaxis due to food, subsequent encounter   2. Cough   3. Mild intermittent reactive airway disease without complication   4. Seasonal allergic rhinitis due to pollen   5. Contact dermatitis due to nickel   6. At risk for obstructive sleep apnea   7. Keratosis pilaris     Meds ordered this encounter  Medications  . montelukast (SINGULAIR) 10 MG tablet    Sig: Take 1 tablet once a day to prevent coughing or wheezing    Dispense:  30 tablet    Refill:  5  . fluticasone (FLONASE) 50 MCG/ACT nasal spray    Sig: Place 2 sprays into both nostrils daily as needed (for stuffy nose).    Dispense:  16 g    Refill:  5  . triamcinolone cream (KENALOG) 0.1 %    Sig: Use twice a day if needed to red itchy areas below the face    Dispense:  45 g    Refill:  3  . azithromycin (ZITHROMAX) 250 MG tablet    Sig: Take 2 tablets on day 1, then 1 tablet once a day for the next 4 days    Dispense:  6 each    Refill:  0    PT WILL CALL  . albuterol (PROAIR HFA) 108 (90 Base) MCG/ACT inhaler    Sig: Inhale 2 puffs into the lungs every 4 (four) hours as needed for wheezing or shortness of breath.    Dispense:  8 g    Refill:  2    Patient Instructions  Environmental control of dust mite Zyrtec 10 mg-take 1 tablet  once a day if needed for runny nose or itchy eyes Fluticasone 2 sprays per nostril once a day if needed for stuffy nose Visine AC-1 drop 3 times a day if needed for itchy eyes  Montelukast 10 mg-take 1 tablet once a day to prevent coughing or wheezing Albuterol HFA-2 puffs every 4 hours if needed for coughing or wheezing.  You may use albuterol 2 puffs 5 to 15 minutes before exercise .  Let us know if it affects your history of PVCs Add prednisone 10 mg twice a day for 4 days, 10 mg on the fifth day to bring your allergic symptoms under control  If your symptoms are not improved, I recommend adding Zithromax 250 mg-take 2 tablets on day 1 then 1 tablet once a day for the next 4 days.  The mucus at times tastes  acidic and he may have damaged areas of his epithelium following the COVID-19 infection  Triamcinolone 0.1% cream twice a day if needed to red itchy areas below the face  You should have a sleep study because of your low pulse ox measurements when you wake up, and your history of stopping breathing at night.  We will schedule a sleep study and have [facility]   interpret  the study.  I will call you with the results of your blood test for shellfish allergy .  With your blood test we will include a CBC with differential and a sedimentation rate to screen for inflammation.  Avoid shellfish until you hear from me .  Ask your parents if  you had  [facility] fever growing up .  This would have been a bronchitis though some patients have very mild disease.  This could explain the tiny nodules you have in your chest.  No treatment necessary   Call us if you are not doing well on this treatment plan   Return in about 4 weeks (around 01/25/2020).   Thank you for the opportunity to care for this patient.  Please do not hesitate to contact me with questions.  Penne Lash, M.D.  [facility] [address] [place], [place] [address] [phone]

## 2019-12-29 LAB — CBC WITH DIFFERENTIAL/PLATELET
Basophils Absolute: 0 10*3/uL (ref 0.0–0.2)
Basos: 1 %
EOS (ABSOLUTE): 0.1 10*3/uL (ref 0.0–0.4)
Eos: 2 %
Hematocrit: 45.3 % (ref 37.5–51.0)
Hemoglobin: 15.1 g/dL (ref 13.0–17.7)
Immature Grans (Abs): 0 10*3/uL (ref 0.0–0.1)
Immature Granulocytes: 0 %
Lymphocytes Absolute: 1.6 10*3/uL (ref 0.7–3.1)
Lymphs: 37 %
MCH: 26.8 pg (ref 26.6–33.0)
MCHC: 33.3 g/dL (ref 31.5–35.7)
MCV: 80 fL (ref 79–97)
Monocytes Absolute: 0.3 10*3/uL (ref 0.1–0.9)
Monocytes: 7 %
Neutrophils Absolute: 2.2 10*3/uL (ref 1.4–7.0)
Neutrophils: 53 %
Platelets: 268 10*3/uL (ref 150–450)
RBC: 5.64 x10E6/uL (ref 4.14–5.80)
RDW: 12.4 % (ref 11.6–15.4)
WBC: 4.2 10*3/uL (ref 3.4–10.8)

## 2019-12-29 LAB — SEDIMENTATION RATE: Sed Rate: 2 mm/hr (ref 0–15)

## 2019-12-29 NOTE — Telephone Encounter (Signed)
I left a detailed voicemail per the patients DPR with this information. I also gave him their contact information.   Thanks Dr Shaune Leeks

## 2019-12-29 NOTE — Addendum Note (Signed)
Addended by: Orpah Greek D on: 12/29/2019 02:28 PM   Modules accepted: Orders

## 2019-12-29 NOTE — Telephone Encounter (Signed)
I talked to [place]  today.  She will let the patient know that  [facility] is the best site compared to [place] sleep study .  She will let the patient know why we chose [facility]

## 2020-01-01 LAB — ALLERGEN PROFILE, SHELLFISH
Clam IgE: 0.28 kU/L — AB
F023-IgE Crab: 1.53 kU/L — AB
F080-IgE Lobster: 1.37 kU/L — AB
F290-IgE Oyster: 0.51 kU/L — AB
Scallop IgE: 0.31 kU/L — AB
Shrimp IgE: 1.05 kU/L — AB

## 2020-01-04 ENCOUNTER — Encounter: Payer: Self-pay | Admitting: Family Medicine

## 2020-01-31 ENCOUNTER — Ambulatory Visit (INDEPENDENT_AMBULATORY_CARE_PROVIDER_SITE_OTHER): Payer: No Typology Code available for payment source | Admitting: Family Medicine

## 2020-01-31 ENCOUNTER — Encounter: Payer: Self-pay | Admitting: Family Medicine

## 2020-01-31 ENCOUNTER — Other Ambulatory Visit: Payer: Self-pay

## 2020-01-31 VITALS — BP 98/68 | HR 68 | Temp 97.5°F | Resp 12

## 2020-01-31 DIAGNOSIS — J4520 Mild intermittent asthma, uncomplicated: Secondary | ICD-10-CM

## 2020-01-31 DIAGNOSIS — J301 Allergic rhinitis due to pollen: Secondary | ICD-10-CM | POA: Insufficient documentation

## 2020-01-31 DIAGNOSIS — H1010 Acute atopic conjunctivitis, unspecified eye: Secondary | ICD-10-CM | POA: Diagnosis not present

## 2020-01-31 DIAGNOSIS — T7800XD Anaphylactic reaction due to unspecified food, subsequent encounter: Secondary | ICD-10-CM

## 2020-01-31 DIAGNOSIS — Z9189 Other specified personal risk factors, not elsewhere classified: Secondary | ICD-10-CM | POA: Insufficient documentation

## 2020-01-31 DIAGNOSIS — L2084 Intrinsic (allergic) eczema: Secondary | ICD-10-CM | POA: Insufficient documentation

## 2020-01-31 DIAGNOSIS — J45909 Unspecified asthma, uncomplicated: Secondary | ICD-10-CM | POA: Insufficient documentation

## 2020-01-31 MED ORDER — EPINEPHRINE 0.3 MG/0.3ML IJ SOAJ: INTRAMUSCULAR | 1 refills | Status: DC

## 2020-01-31 MED ORDER — EPINEPHRINE 0.3 MG/0.3ML IJ SOAJ
INTRAMUSCULAR | 1 refills | Status: DC
Start: 2020-01-31 — End: 2020-09-12

## 2020-01-31 MED FILL — EPINEPHRINE 0.3 MG AUTO-INJ: 0.3 | 30 days supply | Qty: 2 | Fill #0

## 2020-01-31 NOTE — Patient Instructions (Addendum)
Asthma Continue montelukast 10 mg-take 1 tablet once a day to prevent coughing or wheezing Continue albuterol HFA-2 puffs every 4 hours if needed for coughing or wheezing.   You may use albuterol 2 puffs 5 to 15 minutes before exercise .  Follow up with chest CT as recommended by your primary care provider  Allergic rhinitis Continue allergen avoidance measures directed toward grass pollen, weed pollen, tree pollen, dust mite, cockroach, and cat as listed below Continue Zyrtec 10 mg-take 1 tablet once a day if needed for runny nose or itchy eyes Continue Flonase 2 sprays per nostril once a day if needed for stuffy nose.  In the right nostril, point the applicator out toward the right ear. In the left nostril, point the applicator out toward the left ear Consider saline nasal rinses as needed for nasal symptoms. Use this before any medicated nasal sprays for best result  Allergic conjunctivitis Continue Visine AC-1 drop 3 times a day if needed for itchy eyes. Some over the counter eye drops include Pataday one drop in each eye once a day as needed for red, itchy eyes OR Zaditor one drop in each eye twice a day as needed for red itchy eyes.  Atopic dermaitits Continue a daily moisturizing routine Continue triamcinolone 0.1% cream twice a day if needed to red itchy areas below the face  Food allergy Continue to avoid shellfish. In case of an allergic reaction, take Benadryl 50 mg every 4 hours, and if life-threatening symptoms occur, inject with EpiPen 0.3 mg.  At risk for sleep apnea Follow up with the sleep study as you have planned to  Call us if you are not doing well on this treatment plan  Follow up in 6 months or sooner if needed.  Reducing Pollen Exposure The American Academy of Allergy, Asthma and Immunology suggests the following steps to reduce your exposure to pollen during allergy seasons. 1. Do not hang sheets or clothing out to dry; pollen may collect on these items. 2. Do  not mow lawns or spend time around freshly cut grass; mowing stirs up pollen. 3. Keep windows closed at night.  Keep car windows closed while driving. 4. Minimize morning activities outdoors, a time when pollen counts are usually at their highest. 5. Stay indoors as much as possible when pollen counts or humidity is high and on windy days when pollen tends to remain in the air longer. 6. Use air conditioning when possible.  Many air conditioners have filters that trap the pollen spores. 7. Use a HEPA room air filter to remove pollen form the indoor air you breathe.   Control of Dust Mite Allergen Dust mites play a major role in allergic asthma and rhinitis. They occur in environments with high humidity wherever human skin is found. Dust mites absorb humidity from the atmosphere (ie, they do not drink) and feed on organic matter (including shed human and animal skin). Dust mites are a microscopic type of insect that you cannot see with the naked eye. High levels of dust mites have been detected from mattresses, pillows, carpets, upholstered furniture, bed covers, clothes, soft toys and any woven material. The principal allergen of the dust mite is found in its feces. A gram of dust may contain 1,000 mites and 250,000 fecal particles. Mite antigen is easily measured in the air during house cleaning activities. Dust mites do not bite and do not cause harm to humans, other than by triggering allergies/asthma.  Ways to decrease your exposure to  dust mites in your home:  1. Encase mattresses, box springs and pillows with a mite-impermeable barrier or cover  2. Wash sheets, blankets and drapes weekly in hot water (130 F) with detergent and dry them in a dryer on the hot setting.  3. Have the room cleaned frequently with a vacuum cleaner and a damp dust-mop. For carpeting or rugs, vacuuming with a vacuum cleaner equipped with a high-efficiency particulate air (HEPA) filter. The dust mite allergic  individual should not be in a room which is being cleaned and should wait 1 hour after cleaning before going into the room.  4. Do not sleep on upholstered furniture (eg, couches).  5. If possible removing carpeting, upholstered furniture and drapery from the home is ideal. Horizontal blinds should be eliminated in the rooms where the person spends the most time (bedroom, study, television room). Washable vinyl, roller-type shades are optimal.  6. Remove all non-washable stuffed toys from the bedroom. Wash stuffed toys weekly like sheets and blankets above.  7. Reduce indoor humidity to less than 50%. Inexpensive humidity monitors can be purchased at most hardware stores. Do not use a humidifier as can make the problem worse and are not recommended.  Control of Cockroach Allergen  Cockroach allergen has been identified as an important cause of acute attacks of asthma, especially in urban settings.  There are fifty-five species of cockroach that exist in the [place], however only three, the [place], [place] species produce allergen that can affect patients with Asthma.  Allergens can be obtained from fecal particles, egg casings and secretions from cockroaches.    1. Remove food sources. 2. Reduce access to water. 3. Seal access and entry points. 4. Spray runways with 0.5-1% Diazinon or Chlorpyrifos 5. Blow boric acid power under stoves and refrigerator. 6. Place bait stations (hydramethylnon) at feeding sites.   Control of Dog or Cat Allergen Avoidance is the best way to manage a dog or cat allergy. If you have a dog or cat and are allergic to dog or cats, consider removing the dog or cat from the home. If you have a dog or cat but don't want to find it a new home, or if your family wants a pet even though someone in the household is allergic, here are some strategies that may help keep symptoms at bay:  8. Keep the pet out of your bedroom and restrict it to only a few  rooms. Be advised that keeping the dog or cat in only one room will not limit the allergens to that room. 9. Don't pet, hug or kiss the dog or cat; if you do, wash your hands with soap and water. 10. High-efficiency particulate air (HEPA) cleaners run continuously in a bedroom or living room can reduce allergen levels over time. 11. Regular use of a high-efficiency vacuum cleaner or a central vacuum can reduce allergen levels. 12. Giving your dog or cat a bath at least once a week can reduce airborne allergen.

## 2020-01-31 NOTE — Progress Notes (Addendum)
[place] [address] Dept: [phone]  FOLLOW UP NOTE  Patient ID: Scott Costa, male    DOB: [birth year]  Age: 29 y.o. MRN: [id number] Date of Office Visit: 01/31/2020  Assessment  Chief Complaint: Cough  HPI Scott Costa is a 29 year old male who presents to the clinic for follow-up visit.  He was last seen in this clinic on 12/28/2019 Dr. Shaune Leeks for evaluation of asthma, allergic rhinitis, and atopic dermatitis.  At that time he had positive skin testing to grass, weed, tree, dust mite, cockroach, and cat.  He also required Zithromax and it was recommended that he follow-up with a sleep study.  At today's visit, he reports his asthma has been well controlled with occasional shortness of breath while hiking or doing vigorous exercise, no wheeze, and occasional rattly cough without mucus production.  He continues montelukast 10 mg once a day with no adverse side effects.  He has not used his albuterol since his last visit to this clinic. Regarding the small nodules in his lungs taht were noted on chest CT on 08/18/2019, he reports that he did grow up in the [facility] where incidences of coccidioidosis is high. He reports that he may have experienced [facility] fever as a child, however, cannot remember any details of this at this time. Allergic rhinitis is reported as moderately well controlled with symptoms including postnasal drainage and throat clearing for which he continues cetirizine 10 mg once a day and has not been using Flonase or nasal saline rinse at this time.  Allergic conjunctivitis is reported as moderately well controlled with the addition of cetirizine.  Atopic dermatitis is reported as occurring on the right flank intermittently for the last 10 years and lasting several days to 2 months.  He reports, when flared, the area as raised, red, rough, and itchy.  He reports that this area has not flared since he has had his last visit with  [name].  He continues to avoid shellfish with no accidental ingestion or EpiPen use since his last visit to this clinic. EpiPen's have been ordered.  His current medications are listed in the chart.   Drug Allergies:  Allergies  Allergen Reactions  . Shellfish Allergy Anaphylaxis  . Nickel     Physical Exam: BP 98/68 (BP Location: Right Arm, Patient Position: Sitting, Cuff Size: Normal)   Pulse 68   Temp (!) 97.5 F (36.4 C) (Oral)   Resp 12   SpO2 99%    Physical Exam Vitals reviewed.  Constitutional:      Appearance: Normal appearance.  HENT:     Head: Normocephalic and atraumatic.     Right Ear: Tympanic membrane normal.     Left Ear: Tympanic membrane normal.     Nose: Nose normal.     Comments: Bilateral nares normal. Pharynx with cobblestone appearance. Ears normal. Eyes normal Eyes:     Conjunctiva/sclera: Conjunctivae normal.  Cardiovascular:     Rate and Rhythm: Normal rate and regular rhythm.     Heart sounds: Normal heart sounds. No murmur heard.   Pulmonary:     Effort: Pulmonary effort is normal.     Breath sounds: Normal breath sounds.     Comments: Lungs clear to auscultation Musculoskeletal:        General: Normal range of motion.     Cervical back: Normal range of motion and neck supple.  Skin:    General: Skin is warm and dry.  Neurological:     Mental Status: He is alert and oriented to person, place, and time.  Psychiatric:        Mood and Affect: Mood normal.        Behavior: Behavior normal.        Thought Content: Thought content normal.        Judgment: Judgment normal.     Diagnostics: FVC 6.71, FEV1 5.37.  Predicted FVC 5.83, predicted FEV1 4.73.  Spirometry indicates normal ventilatory function.  Assessment and Plan: 1. Mild intermittent reactive airway disease without complication   2. Seasonal allergic rhinitis due to pollen   3. Seasonal allergic conjunctivitis   4. Anaphylaxis due to food, subsequent encounter   5. At risk for  obstructive sleep apnea   6. Intrinsic atopic dermatitis     Patient Instructions  Allergic rhinitis Continue allergen avoidance measures directed toward grass pollen, weed pollen, tree pollen, dust mite, cockroach, and cat as listed below Continue Zyrtec 10 mg-take 1 tablet once a day if needed for runny nose or itchy eyes Continue Flonase 2 sprays per nostril once a day if needed for stuffy nose.  In the right nostril, point the applicator out toward the right ear. In the left nostril, point the applicator out toward the left ear Consider saline nasal rinses as needed for nasal symptoms. Use this before any medicated nasal sprays for best result  Allergic conjunctivitis Continue Visine AC-1 drop 3 times a day if needed for itchy eyes. Some over the counter eye drops include Pataday one drop in each eye once a day as needed for red, itchy eyes OR Zaditor one drop in each eye twice a day as needed for red itchy eyes.  Asthma Continue montelukast 10 mg-take 1 tablet once a day to prevent coughing or wheezing Continue albuterol HFA-2 puffs every 4 hours if needed for coughing or wheezing.   You may use albuterol 2 puffs 5 to 15 minutes before exercise .   Atopic dermaitits Continue a daily moisturizing routine Continue triamcinolone 0.1% cream twice a day if needed to red itchy areas below the face  Food allergy Continue to avoid shellfish. In case of an allergic reaction, take Benadryl 50 mg every 4 hours, and if life-threatening symptoms occur, inject with EpiPen 0.3 mg.  At risk for sleep apnea Follow up with the sleep study as you have planned to  Call us if you are not doing well on this treatment plan  Follow up in 6 months or sooner if needed.  Return in about 6 months (around 08/01/2020), or if symptoms worsen or fail to improve.    Thank you for the opportunity to care for this patient.  Please do not hesitate to contact me with questions.  [name], FNP [facility]  ________________________________________________  I have provided oversight concerning Scott Costa's evaluation and treatment of this patient's health issues addressed during today's encounter.  I agree with the assessment and therapeutic plan as outlined in the note.   Signed,   R Edgar Frisk, MD

## 2020-02-09 MED FILL — MONTELUKAST SOD 10 MG TAB: 10 | 30 days supply | Qty: 30 | Fill #1

## 2020-03-06 ENCOUNTER — Encounter: Payer: Self-pay | Admitting: Family Medicine

## 2020-03-06 ENCOUNTER — Other Ambulatory Visit: Payer: Self-pay

## 2020-03-06 ENCOUNTER — Ambulatory Visit (INDEPENDENT_AMBULATORY_CARE_PROVIDER_SITE_OTHER): Payer: No Typology Code available for payment source | Admitting: Family Medicine

## 2020-03-06 VITALS — BP 112/80 | HR 84 | Temp 98.2°F | Resp 18 | Ht 73.0 in | Wt 165.2 lb

## 2020-03-06 DIAGNOSIS — Z0001 Encounter for general adult medical examination with abnormal findings: Secondary | ICD-10-CM | POA: Diagnosis not present

## 2020-03-06 DIAGNOSIS — J4520 Mild intermittent asthma, uncomplicated: Secondary | ICD-10-CM

## 2020-03-06 DIAGNOSIS — Z13220 Encounter for screening for lipoid disorders: Secondary | ICD-10-CM | POA: Diagnosis not present

## 2020-03-06 DIAGNOSIS — R2990 Unspecified symptoms and signs involving the nervous system: Secondary | ICD-10-CM | POA: Diagnosis not present

## 2020-03-06 NOTE — Progress Notes (Signed)
Chief Complaint:  Scott Costa is a 29 y.o. male who presents today for his annual comprehensive physical exam.    Assessment/Plan:  New/Acute Problems: Multiple neurologic symptoms No red flags.  Slightly hyperreflexive on exam today but otherwise normal neurologic exam.  Could be sequela of recent Covid vaccine given timing.  Patient will come back for labs including CBC, CMET, TSH, CRP, sed rate, CK, and B12.  If symptoms persist would consider trial of prednisone depending on labs.  May need referral for imaging and/or neurology in the future.  Chronic Problems Addressed Today: Reactive airway disease Doing much better since started on Singulair by allergy.  Preventative Healthcare: Up-to-date on vaccines.  Check lipid panel.   Patient Counseling(The following topics were reviewed and/or handout was given):  -Nutrition: Stressed importance of moderation in sodium/caffeine intake, saturated fat and cholesterol, caloric balance, sufficient intake of fresh fruits, vegetables, and fiber.  -Stressed the importance of regular exercise.   -Substance Abuse: Discussed cessation/primary prevention of tobacco, alcohol, or other drug use; driving or other dangerous activities under the influence; availability of treatment for abuse.   -Injury prevention: Discussed safety belts, safety helmets, smoke detector, smoking near bedding or upholstery.   -Sexuality: Discussed sexually transmitted diseases, partner selection, use of condoms, avoidance of unintended pregnancy and contraceptive alternatives.   -Dental health: Discussed importance of regular tooth brushing, flossing, and dental visits.  -Health maintenance and immunizations reviewed. Please refer to Health maintenance section.  Return to care in 1 year for next preventative visit.     Subjective:  HPI:  He has no acute complaints today.   Patient received second dose of Madonna Covid vaccine about a month ago.  Since then he  has had several multiple intermittent neurologic symptoms.  Symptoms include vibration sensation in bilateral feet and hands.  Symptoms come and go.  He has also had sharp transient pain throughout all of his body.  He has had paresthesias on the inner part of his legs.  Also some feeling of discoordination and heaviness in arms and legs.  Symptoms are overall stable.  Symptoms come and go.  He has noticed some tingling in his nose and lips.  He has noticed decreased exercise tolerance as well.  He noticed some numbness in the middle the night that goes away after getting up and moving around for a bit.  Lifestyle Diet: Balanced. Plenty of fruits and vegetables.  Exercise: Likes hiking  Depression screen [facility] 2/9 03/06/2020  Decreased Interest 0  Down, Depressed, Hopeless 0  PHQ - 2 Score 0    Health Maintenance Due  Topic Date Due  . Hepatitis C Screening  Never done  . HIV Screening  Never done  . INFLUENZA VACCINE  03/05/2020     ROS: Per HPI, otherwise a complete review of systems was negative.   PMH:  The following were reviewed and entered/updated in epic: Past Medical History:  Diagnosis Date  . Fainting spell    Due to blood draws  . Food allergy   . GERD (gastroesophageal reflux disease)   . Insomnia    Patient Active Problem List   Diagnosis Date Noted  . Reactive airway disease 01/31/2020  . Seasonal allergic rhinitis due to pollen 01/31/2020  . Seasonal allergic conjunctivitis 01/31/2020  . Anaphylaxis due to food, subsequent encounter 01/31/2020  . Intrinsic atopic dermatitis 01/31/2020  . At risk for obstructive sleep apnea 01/31/2020  . Gastroesophageal reflux disease 10/11/2019  . Insomnia 10/11/2019  .  Atypical nevus 01/22/2019  . Rash 01/22/2019  . Sebaceous cyst 01/22/2019   Past Surgical History:  Procedure Laterality Date  . LIPOMA EXCISION    . MOLE REMOVAL    . UPPER GI ENDOSCOPY  12/06/2019    Family History  Problem Relation Age of Onset  .  Allergic rhinitis Mother   . Hypertension Father   . Colon cancer Paternal Grandfather 64  . Emphysema Paternal Grandmother   . Esophageal cancer Neg Hx   . Stomach cancer Neg Hx   . Rectal cancer Neg Hx   . Asthma Neg Hx   . Eczema Neg Hx   . Urticaria Neg Hx   . Immunodeficiency Neg Hx   . Angioedema Neg Hx   . Atopy Neg Hx     Medications- reviewed and updated Current Outpatient Medications  Medication Sig Dispense Refill  . fluticasone (FLONASE) 50 MCG/ACT nasal spray Place 2 sprays into both nostrils daily as needed (for stuffy nose). 16 g 5  . montelukast (SINGULAIR) 10 MG tablet Take 1 tablet once a day to prevent coughing or wheezing 30 tablet 5  . triamcinolone cream (KENALOG) 0.1 % Use twice a day if needed to red itchy areas below the face 45 g 3  . cetirizine (ZYRTEC) 10 MG tablet Take 10 mg by mouth daily. (Patient not taking: Reported on 03/06/2020)    . EPINEPHrine 0.3 mg/0.3 mL IJ SOAJ injection Use as directed for severe allergic reactions (Patient not taking: Reported on 03/06/2020) 2 each 1   No current facility-administered medications for this visit.    Allergies-reviewed and updated Allergies  Allergen Reactions  . Shellfish Allergy Anaphylaxis  . Nickel     Social History   Socioeconomic History  . Marital status: Single    Spouse name: Not on file  . Number of children: 0  . Years of education: Not on file  . Highest education level: Not on file  Occupational History  . Occupation: CMA  Tobacco Use  . Smoking status: Former Smoker    Packs/day: 0.40    Years: 5.00    Pack years: 2.00    Start date: 2006    Quit date: 2014    Years since quitting: 7.5  . Smokeless tobacco: Never Used  Vaping Use  . Vaping Use: Never used  Substance and Sexual Activity  . Alcohol use: Yes    Comment: occasional  . Drug use: Never  . Sexual activity: Not on file  Other Topics Concern  . Not on file  Social History Narrative  . Not on file   Social  Determinants of Health   Financial Resource Strain:   . Difficulty of Paying Living Expenses:   Food Insecurity:   . Worried About Charity fundraiser in the Last Year:   . Arboriculturist in the Last Year:   Transportation Needs:   . Film/video editor (Medical):   Marland Kitchen Lack of Transportation (Non-Medical):   Physical Activity:   . Days of Exercise per Week:   . Minutes of Exercise per Session:   Stress:   . Feeling of Stress :   Social Connections:   . Frequency of Communication with Friends and Family:   . Frequency of Social Gatherings with Friends and Family:   . Attends Religious Services:   . Active Member of Clubs or Organizations:   . Attends Archivist Meetings:   Marland Kitchen Marital Status:  Objective:  Physical Exam: BP 112/80   Pulse 84   Temp 98.2 F (36.8 C) (Temporal)   Resp 18   Ht 6\' 1"  (1.854 m)   Wt 165 lb 3.2 oz (74.9 kg)   SpO2 98%   BMI 21.80 kg/m   Body mass index is 21.8 kg/m. Wt Readings from Last 3 Encounters:  03/06/20 165 lb 3.2 oz (74.9 kg)  12/28/19 162 lb (73.5 kg)  11/15/19 157 lb 4 oz (71.3 kg)   Gen: NAD, resting comfortably HEENT: TMs normal bilaterally. OP clear. No thyromegaly noted.  CV: RRR with no murmurs appreciated Pulm: NWOB, CTAB with no crackles, wheezes, or rhonchi GI: Normal bowel sounds present. Soft, Nontender, Nondistended. MSK: no edema, cyanosis, or clubbing noted Skin: warm, dry Neuro: Cranial nerves II through XII intact.  Finger-nose-finger testing intact bilaterally.  Babinski sign normal.  Reflexes 3+ at Achilles tendon, patellar tendon, and biceps tendon but symmetric bilaterally.  Normal monofilament testing in feet bilaterally. Psych: Normal affect and thought content     Raistlin Gum M. [name], MD 03/06/2020 9:52 AM

## 2020-03-06 NOTE — Assessment & Plan Note (Signed)
Doing much better since started on Singulair by allergy.

## 2020-03-06 NOTE — Patient Instructions (Signed)
It was very nice to see you today!  We will check blood work.  Please let us know if your symptoms worsen.  I will see back in year for your next physical.  Please come back to see me sooner if needed.  Take care, Dr [name]  Please try these tips to maintain a healthy lifestyle:   Eat at least 3 REAL meals and 1-2 snacks per day.  Aim for no more than 5 hours between eating.  If you eat breakfast, please do so within one hour of getting up.    Each meal should contain half fruits/vegetables, one quarter protein, and one quarter carbs (no bigger than a computer mouse)   Cut down on sweet beverages. This includes juice, soda, and sweet tea.     Drink at least 1 glass of water with each meal and aim for at least 8 glasses per day   Exercise at least 150 minutes every week.    Preventive Care 36-47 Years Old, Male Preventive care refers to lifestyle choices and visits with your health care provider that can promote health and wellness. This includes:  A yearly physical exam. This is also called an annual well check.  Regular dental and eye exams.  Immunizations.  Screening for certain conditions.  Healthy lifestyle choices, such as eating a healthy diet, getting regular exercise, not using drugs or products that contain nicotine and tobacco, and limiting alcohol use. What can I expect for my preventive care visit? Physical exam Your health care provider will check:  Height and weight. These may be used to calculate body mass index (BMI), which is a measurement that tells if you are at a healthy weight.  Heart rate and blood pressure.  Your skin for abnormal spots. Counseling Your health care provider may ask you questions about:  Alcohol, tobacco, and drug use.  Emotional well-being.  Home and relationship well-being.  Sexual activity.  Eating habits.  Work and work Statistician. What immunizations do I need?  Influenza (flu) vaccine  This is  recommended every year. Tetanus, diphtheria, and pertussis (Tdap) vaccine  You may need a Td booster every 10 years. Varicella (chickenpox) vaccine  You may need this vaccine if you have not already been vaccinated. Human papillomavirus (HPV) vaccine  If recommended by your health care provider, you may need three doses over 6 months. Measles, mumps, and rubella (MMR) vaccine  You may need at least one dose of MMR. You may also need a second dose. Meningococcal conjugate (MenACWY) vaccine  One dose is recommended if you are 15-37 years old and a Market researcher living in a residence hall, or if you have one of several medical conditions. You may also need additional booster doses. Pneumococcal conjugate (PCV13) vaccine  You may need this if you have certain conditions and were not previously vaccinated. Pneumococcal polysaccharide (PPSV23) vaccine  You may need one or two doses if you smoke cigarettes or if you have certain conditions. Hepatitis A vaccine  You may need this if you have certain conditions or if you travel or work in places where you may be exposed to hepatitis A. Hepatitis B vaccine  You may need this if you have certain conditions or if you travel or work in places where you may be exposed to hepatitis B. Haemophilus influenzae type b (Hib) vaccine  You may need this if you have certain risk factors. You may receive vaccines as individual doses or as more than one vaccine together  in one shot (combination vaccines). Talk with your health care provider about the risks and benefits of combination vaccines. What tests do I need? Blood tests  Lipid and cholesterol levels. These may be checked every 5 years starting at age 85.  Hepatitis C test.  Hepatitis B test. Screening   Diabetes screening. This is done by checking your blood sugar (glucose) after you have not eaten for a while (fasting).  Sexually transmitted disease (STD) testing. Talk with  your health care provider about your test results, treatment options, and if necessary, the need for more tests. Follow these instructions at home: Eating and drinking   Eat a diet that includes fresh fruits and vegetables, whole grains, lean protein, and low-fat dairy products.  Take vitamin and mineral supplements as recommended by your health care provider.  Do not drink alcohol if your health care provider tells you not to drink.  If you drink alcohol: ? Limit how much you have to 0-2 drinks a day. ? Be aware of how much alcohol is in your drink. In the [place] one drink equals one 12 oz bottle of beer (355 mL), one 5 oz glass of wine (148 mL), or one 1 oz glass of hard liquor (44 mL). Lifestyle  Take daily care of your teeth and gums.  Stay active. Exercise for at least 30 minutes on 5 or more days each week.  Do not use any products that contain nicotine or tobacco, such as cigarettes, e-cigarettes, and chewing tobacco. If you need help quitting, ask your health care provider.  If you are sexually active, practice safe sex. Use a condom or other form of protection to prevent STIs (sexually transmitted infections). What's next?  Go to your health care provider once a year for a well check visit.  Ask your health care provider how often you should have your eyes and teeth checked.  Stay up to date on all vaccines. This information is not intended to replace advice given to you by your health care provider. Make sure you discuss any questions you have with your health care provider. Document Revised: 07/16/2018 Document Reviewed: 07/16/2018 Elsevier Patient Education  2020 [employer].

## 2020-03-07 ENCOUNTER — Other Ambulatory Visit: Payer: No Typology Code available for payment source

## 2020-03-07 DIAGNOSIS — Z13220 Encounter for screening for lipoid disorders: Secondary | ICD-10-CM

## 2020-03-07 DIAGNOSIS — R2990 Unspecified symptoms and signs involving the nervous system: Secondary | ICD-10-CM

## 2020-03-08 LAB — COMPREHENSIVE METABOLIC PANEL
AG Ratio: 2.1 (calc) (ref 1.0–2.5)
ALT: 7 U/L — ABNORMAL LOW (ref 9–46)
AST: 11 U/L (ref 10–40)
Albumin: 4.7 g/dL (ref 3.6–5.1)
Alkaline phosphatase (APISO): 46 U/L (ref 36–130)
BUN: 13 mg/dL (ref 7–25)
CO2: 31 mmol/L (ref 20–32)
Calcium: 9.5 mg/dL (ref 8.6–10.3)
Chloride: 102 mmol/L (ref 98–110)
Creat: 0.78 mg/dL (ref 0.60–1.35)
Globulin: 2.2 g/dL (calc) (ref 1.9–3.7)
Glucose, Bld: 86 mg/dL (ref 65–99)
Potassium: 3.7 mmol/L (ref 3.5–5.3)
Sodium: 139 mmol/L (ref 135–146)
Total Bilirubin: 0.9 mg/dL (ref 0.2–1.2)
Total Protein: 6.9 g/dL (ref 6.1–8.1)

## 2020-03-08 LAB — CBC
HCT: 43.2 % (ref 38.5–50.0)
Hemoglobin: 14 g/dL (ref 13.2–17.1)
MCH: 26.6 pg — ABNORMAL LOW (ref 27.0–33.0)
MCHC: 32.4 g/dL (ref 32.0–36.0)
MCV: 82 fL (ref 80.0–100.0)
MPV: 10.1 fL (ref 7.5–12.5)
Platelets: 222 10*3/uL (ref 140–400)
RBC: 5.27 10*6/uL (ref 4.20–5.80)
RDW: 13.2 % (ref 11.0–15.0)
WBC: 4.8 10*3/uL (ref 3.8–10.8)

## 2020-03-08 LAB — C-REACTIVE PROTEIN: CRP: 0.2 mg/L (ref <8.0)

## 2020-03-08 LAB — LIPID PANEL
Cholesterol: 181 mg/dL (ref <200)
HDL: 75 mg/dL (ref >=40)
LDL Cholesterol (Calc): 91 mg/dL (calc)
Non-HDL Cholesterol (Calc): 106 mg/dL (calc) (ref <130)
Total CHOL/HDL Ratio: 2.4 (calc) (ref <5.0)
Triglycerides: 65 mg/dL (ref <150)

## 2020-03-08 LAB — VITAMIN B12: Vitamin B-12: 450 pg/mL (ref 200–1100)

## 2020-03-08 LAB — TSH: TSH: 1.51 mIU/L (ref 0.40–4.50)

## 2020-03-08 LAB — CK: Total CK: 117 U/L (ref 44–196)

## 2020-03-08 LAB — SEDIMENTATION RATE: Sed Rate: 2 mm/h (ref 0–15)

## 2020-03-08 NOTE — Progress Notes (Signed)
Please inform patient of the following:  Labs normal - no clear indication of what is causing his symptoms at this point. Recommend neurology referral if patient is interested.

## 2020-03-09 ENCOUNTER — Telehealth: Payer: Self-pay

## 2020-03-09 ENCOUNTER — Encounter: Payer: Self-pay | Admitting: *Deleted

## 2020-03-09 ENCOUNTER — Encounter: Payer: Self-pay | Admitting: Neurology

## 2020-03-09 ENCOUNTER — Other Ambulatory Visit: Payer: Self-pay | Admitting: *Deleted

## 2020-03-09 DIAGNOSIS — R202 Paresthesia of skin: Secondary | ICD-10-CM

## 2020-03-09 NOTE — Telephone Encounter (Signed)
error 

## 2020-03-24 MED FILL — MONTELUKAST SOD 10 MG TAB: 10 | 30 days supply | Qty: 30 | Fill #2

## 2020-04-05 ENCOUNTER — Encounter: Payer: Self-pay | Admitting: Family Medicine

## 2020-04-05 ENCOUNTER — Telehealth: Payer: Self-pay | Admitting: *Deleted

## 2020-04-05 ENCOUNTER — Other Ambulatory Visit: Payer: Self-pay

## 2020-04-05 ENCOUNTER — Ambulatory Visit (INDEPENDENT_AMBULATORY_CARE_PROVIDER_SITE_OTHER): Payer: No Typology Code available for payment source | Admitting: Family Medicine

## 2020-04-05 VITALS — BP 118/88 | HR 83 | Temp 99.2°F | Resp 18 | Ht 73.0 in | Wt 168.8 lb

## 2020-04-05 DIAGNOSIS — F419 Anxiety disorder, unspecified: Secondary | ICD-10-CM | POA: Diagnosis not present

## 2020-04-05 DIAGNOSIS — G1220 Motor neuron disease, unspecified: Secondary | ICD-10-CM

## 2020-04-05 DIAGNOSIS — R2990 Unspecified symptoms and signs involving the nervous system: Secondary | ICD-10-CM | POA: Insufficient documentation

## 2020-04-05 MED ORDER — DIAZEPAM 5 MG PO TABS
5.0000 mg | ORAL_TABLET | Freq: Two times a day (BID) | ORAL | 1 refills | Status: DC | PRN
Start: 2020-04-05 — End: 2020-04-17

## 2020-04-05 MED FILL — diazePAM 5 MG TABS: 5 | 15 days supply | Qty: 30 | Fill #0

## 2020-04-05 NOTE — Telephone Encounter (Signed)
LVM to patient to returned call  If is possible to go today for MRI

## 2020-04-05 NOTE — Patient Instructions (Signed)
It was very nice to see you today!  We will check blood work today.  I would also like to get an MRI.  You should be called soon to have this  scheduled.  Please start the Valium.  Check in with me next week letter know how things are going.  Take care, Dr [name]  Please try these tips to maintain a healthy lifestyle:   Eat at least 3 REAL meals and 1-2 snacks per day.  Aim for no more than 5 hours between eating.  If you eat breakfast, please do so within one hour of getting up.    Each meal should contain half fruits/vegetables, one quarter protein, and one quarter carbs (no bigger than a computer mouse)   Cut down on sweet beverages. This includes juice, soda, and sweet tea.     Drink at least 1 glass of water with each meal and aim for at least 8 glasses per day   Exercise at least 150 minutes every week.

## 2020-04-05 NOTE — Progress Notes (Signed)
   Scott Costa is a 29 y.o. male who presents today for an office visit.  Assessment/Plan:  Chronic Problems Addressed Today: Anxiety Patient concerned about MS.  Has been under quite a bit of stress recently.  Did not tolerate hydroxyzine in the past.  Will start Valium 5 mg twice daily as needed.  Discussed potential side effects.  He will follow-up with me in a week or 2 to let me know how symptoms are going.  Multiple neurological symptoms Patient with visible fasciculations on exam today in addition to hyperreflexia.  Given his numbness/paresthesias think it is warranted to pursue further neurologic work-up at this time.  Had labs done about a month ago including CK, CRP, sed rate, B12 which negative.  Has been referred to neurology but does not have appointment for another 2 months.  Will check MRI brain, C-spine, T-spine, and L-spine.  We will also check additional labs today including CK, CRP, sed rate, Lyme disease antibodies, and ANA.    Subjective:  HPI:  Patient here for follow up for neurological symptoms. Since his last visit, he is no longer having sharp pains or leg weakness but is still having tingling and numbness in hands and feet with occasionally jerking in the face.  Still has numbness in his quads.  Described as a "Jell-O" like sensation.  Over last 3 weeks he has noticed more cramping and posterior legs.  He is also noticed a tremor in his upper arms when extending his arms bilaterally.  Is initially was located on right upper extremity has progressed involve left upper extremity.  No fevers or chills.  Is concerned about possible multiple sclerosiss.  He has had intermittent symptoms for the past 10 years including tightness sensation in his abdomen, vertigo, and oth height she will go the ED er neurologic symptoms.       Objective:  Physical Exam: BP 118/88   Pulse 83   Temp 99.2 F (37.3 C) (Temporal)   Resp 18   Ht 6\' 1"  (1.854 m)   Wt 168 lb 12.8 oz  (76.6 kg)   SpO2 99%   BMI 22.27 kg/m   Gen: No acute distress, resting comfortably CV: Regular rate and rhythm with no murmurs appreciated Pulm: Normal work of breathing, clear to auscultation bilaterally with no crackles, wheezes, or rhonchi NEURO: Cranial nerves II through XII intact.  Fasciculations noted in bilateral arms with extension of upper extremities.  No asterixis.  Triceps and biceps reflexes 3+ bilaterally.  Patellar reflexes 3+ and symmetric bilaterally as well.  No clonus. Neuro: Grossly normal, moves all extremities Psych: Normal affect and thought content  Time Spent: 47 minutes of total time was spent on the date of the encounter performing the following actions: chart review prior to seeing the patient, obtaining history, performing a medically necessary exam including neurologic exam, counseling on the treatment plan, placing orders, and documenting in our EHR.        Algis Greenhouse. [name], MD 04/05/2020 12:15 PM

## 2020-04-05 NOTE — Assessment & Plan Note (Signed)
Patient concerned about MS.  Has been under quite a bit of stress recently.  Did not tolerate hydroxyzine in the past.  Will start Valium 5 mg twice daily as needed.  Discussed potential side effects.  He will follow-up with me in a week or 2 to let me know how symptoms are going.

## 2020-04-05 NOTE — Assessment & Plan Note (Signed)
Patient with visible fasciculations on exam today in addition to hyperreflexia.  Given his numbness/paresthesias think it is warranted to pursue further neurologic work-up at this time.  Had labs done about a month ago including CK, CRP, sed rate, B12 which negative.  Has been referred to neurology but does not have appointment for another 2 months.  Will check MRI brain, C-spine, T-spine, and L-spine.  We will also check additional labs today including CK, CRP, sed rate, Lyme disease antibodies, and ANA.

## 2020-04-06 ENCOUNTER — Ambulatory Visit (HOSPITAL_COMMUNITY): Admission: RE | Admit: 2020-04-06 | Payer: No Typology Code available for payment source | Source: Ambulatory Visit

## 2020-04-06 ENCOUNTER — Ambulatory Visit (HOSPITAL_COMMUNITY)
Admission: RE | Admit: 2020-04-06 | Discharge: 2020-04-06 | Disposition: A | Discharge status: Home / Self Care | Payer: No Typology Code available for payment source | Source: Ambulatory Visit | Attending: Family Medicine | Admitting: Family Medicine

## 2020-04-06 ENCOUNTER — Ambulatory Visit (HOSPITAL_COMMUNITY): Payer: No Typology Code available for payment source

## 2020-04-06 ENCOUNTER — Other Ambulatory Visit (HOSPITAL_COMMUNITY): Payer: No Typology Code available for payment source

## 2020-04-06 DIAGNOSIS — G1220 Motor neuron disease, unspecified: Secondary | ICD-10-CM

## 2020-04-06 LAB — CBC
HCT: 44.2 % (ref 38.5–50.0)
Hemoglobin: 14.3 g/dL (ref 13.2–17.1)
MCH: 26.5 pg — ABNORMAL LOW (ref 27.0–33.0)
MCHC: 32.4 g/dL (ref 32.0–36.0)
MCV: 82 fL (ref 80.0–100.0)
MPV: 10.1 fL (ref 7.5–12.5)
Platelets: 249 10*3/uL (ref 140–400)
RBC: 5.39 10*6/uL (ref 4.20–5.80)
RDW: 13.5 % (ref 11.0–15.0)
WBC: 3.4 10*3/uL — ABNORMAL LOW (ref 3.8–10.8)

## 2020-04-06 LAB — ANA: Anti Nuclear Antibody (ANA): NEGATIVE

## 2020-04-06 LAB — CK: Total CK: 107 U/L (ref 44–196)

## 2020-04-06 LAB — BASIC METABOLIC PANEL
BUN: 10 mg/dL (ref 7–25)
CO2: 28 mmol/L (ref 20–32)
Calcium: 9.6 mg/dL (ref 8.6–10.3)
Chloride: 105 mmol/L (ref 98–110)
Creat: 0.81 mg/dL (ref 0.60–1.35)
Glucose, Bld: 80 mg/dL (ref 65–99)
Potassium: 3.9 mmol/L (ref 3.5–5.3)
Sodium: 141 mmol/L (ref 135–146)

## 2020-04-06 LAB — C-REACTIVE PROTEIN: CRP: <0.2 mg/L (ref <8.0)

## 2020-04-06 LAB — B. BURGDORFI ANTIBODIES: B burgdorferi Ab IgG+IgM: <0.9 index

## 2020-04-06 LAB — SEDIMENTATION RATE: Sed Rate: 2 mm/h (ref 0–15)

## 2020-04-06 NOTE — Telephone Encounter (Signed)
Pt did not returned call.

## 2020-04-07 NOTE — Progress Notes (Signed)
Please inform patient of the following:  MRI and blood work are all nondiagnostic. No clear explanation for his symptoms, but we have ruled out life threatening issues such as a tumor, cancer, bleeding etc. Recommend that he follow up with neurology as previously planned.  Scott Costa. [name], MD 04/07/2020 2:27 PM

## 2020-04-11 ENCOUNTER — Encounter: Payer: Self-pay | Admitting: Family Medicine

## 2020-04-11 ENCOUNTER — Other Ambulatory Visit: Payer: Self-pay | Admitting: Family Medicine

## 2020-04-11 ENCOUNTER — Other Ambulatory Visit: Payer: Self-pay

## 2020-04-11 ENCOUNTER — Ambulatory Visit (INDEPENDENT_AMBULATORY_CARE_PROVIDER_SITE_OTHER): Payer: No Typology Code available for payment source | Admitting: Family Medicine

## 2020-04-11 VITALS — BP 126/81 | HR 76 | Temp 98.1°F | Ht 73.0 in | Wt 169.6 lb

## 2020-04-11 DIAGNOSIS — R2990 Unspecified symptoms and signs involving the nervous system: Secondary | ICD-10-CM

## 2020-04-11 DIAGNOSIS — F419 Anxiety disorder, unspecified: Secondary | ICD-10-CM | POA: Diagnosis not present

## 2020-04-11 MED ORDER — PREDNISONE 50 MG PO TABS: ORAL_TABLET | ORAL | 0 refills | Status: DC

## 2020-04-11 MED FILL — predniSONE 50 MG TABS: 50 | 5 days supply | Qty: 5 | Fill #0

## 2020-04-11 NOTE — Progress Notes (Signed)
   Scott Costa is a 29 y.o. male who presents today for an office visit.  Assessment/Plan:  New/Acute Problems: Low WBC  Check CBC with differential and peripheral smear.  No lymphadenopathy on exam.   Chronic Problems Addressed Today: Multiple neurological symptoms Patient with overall stable symptoms since visit last week.  Discussed recent work-up including labs and MRI.  All of these were nondiagnostic.  At this point symptoms are concerning for possible inflammatory neuropathy.  We discussed risk/benefits of prednisone.  We will start short prednisone burst to see if this has any improvement in his symptoms.  At this point I think further neurological work-up is warranted and he has a referral to neurology pending.  As we do not have any other obvious explanation for symptoms we will check for heavy metal toxicity and magnesium deficiency.   Anxiety Doing better with Valium 7.5 mg daily.     Subjective:  HPI:  See A/p.        Objective:  Physical Exam: BP 126/81   Pulse 76   Temp 98.1 F (36.7 C) (Temporal)   Ht 6\' 1"  (1.854 m)   Wt 169 lb 9.6 oz (76.9 kg)   SpO2 100%   BMI 22.38 kg/m   Gen: No acute distress, resting comfortably  Time Spent: 49 minutes of total time was spent on the date of the encounter performing the following actions: chart review prior to seeing the patient, obtaining history, performing a medically necessary exam, counseling on the treatment plan and reviewing the recent work-up, placing orders, and documenting in our EHR.         Algis Greenhouse. [name], MD 04/11/2020 8:50 AM

## 2020-04-11 NOTE — Assessment & Plan Note (Addendum)
Patient with overall stable symptoms since visit last week.  Discussed recent work-up including labs and MRI.  All of these were nondiagnostic.  At this point symptoms are concerning for possible inflammatory neuropathy.  We discussed risk/benefits of prednisone.  We will start short prednisone burst to see if this has any improvement in his symptoms.  At this point I think further neurological work-up is warranted and he has a referral to neurology pending.  As we do not have any other obvious explanation for symptoms we will check for heavy metal toxicity and magnesium deficiency.

## 2020-04-11 NOTE — Patient Instructions (Signed)
It was very nice to see you today!  We will check blood work and a urine sample today.  Take care, Dr [name]  Please try these tips to maintain a healthy lifestyle:   Eat at least 3 REAL meals and 1-2 snacks per day.  Aim for no more than 5 hours between eating.  If you eat breakfast, please do so within one hour of getting up.    Each meal should contain half fruits/vegetables, one quarter protein, and one quarter carbs (no bigger than a computer mouse)   Cut down on sweet beverages. This includes juice, soda, and sweet tea.     Drink at least 1 glass of water with each meal and aim for at least 8 glasses per day   Exercise at least 150 minutes every week.

## 2020-04-11 NOTE — Assessment & Plan Note (Signed)
Doing better with Valium 7.5 mg daily.

## 2020-04-11 NOTE — Addendum Note (Signed)
Addended by: Betti Tollison on: 04/11/2020 09:14 AM   Modules accepted: Orders

## 2020-04-12 LAB — B. BURGDORFI ANTIBODIES: B burgdorferi Ab IgG+IgM: <0.9 index

## 2020-04-13 ENCOUNTER — Encounter: Payer: Self-pay | Admitting: Family Medicine

## 2020-04-13 ENCOUNTER — Telehealth: Payer: Self-pay | Admitting: Family Medicine

## 2020-04-13 NOTE — Telephone Encounter (Signed)
Please advise 

## 2020-04-13 NOTE — Telephone Encounter (Signed)
Patient called in and stated that he saw his mychart results and wanted to go over them with Dr. [name] patient is scheduled for Monday. Patient wanted to know if Dr. [name] wants him to start the prednisone since his iron was low. Patient would like just a mychart message to respond and how he is suppose to do the urine test for the heavy metal .

## 2020-04-13 NOTE — Progress Notes (Signed)
Please inform patient of the following:  Blood work is NORMAL but the pathologist was concerned about iron deficiency. HE can come back to have this drawn. Please place future order for ferritin, iron, and TIBC.  Algis Greenhouse. [name], MD 04/13/2020 1:11 PM

## 2020-04-14 LAB — CBC WITH DIFFERENTIAL/PLATELET
Absolute Monocytes: 279 cells/uL (ref 200–950)
Basophils Absolute: 31 cells/uL (ref 0–200)
Basophils Relative: 0.9 %
Eosinophils Absolute: 109 cells/uL (ref 15–500)
Eosinophils Relative: 3.2 %
HCT: 44 % (ref 38.5–50.0)
Hemoglobin: 14.3 g/dL (ref 13.2–17.1)
Lymphs Abs: 1462 cells/uL (ref 850–3900)
MCH: 26.7 pg — ABNORMAL LOW (ref 27.0–33.0)
MCHC: 32.5 g/dL (ref 32.0–36.0)
MCV: 82.2 fL (ref 80.0–100.0)
MPV: 10.6 fL (ref 7.5–12.5)
Monocytes Relative: 8.2 %
Neutro Abs: 1520 cells/uL (ref 1500–7800)
Neutrophils Relative %: 44.7 %
Platelets: 234 10*3/uL (ref 140–400)
RBC: 5.35 10*6/uL (ref 4.20–5.80)
RDW: 13.4 % (ref 11.0–15.0)
Total Lymphocyte: 43 %
WBC: 3.4 10*3/uL — ABNORMAL LOW (ref 3.8–10.8)

## 2020-04-14 LAB — PATHOLOGIST SMEAR REVIEW

## 2020-04-14 LAB — MAGNESIUM: Magnesium: 2.1 mg/dL (ref 1.5–2.5)

## 2020-04-14 LAB — HEAVY METALS PROFILE, URINE

## 2020-04-14 LAB — CERULOPLASMIN: Ceruloplasmin: 27 mg/dL (ref 18–36)

## 2020-04-14 NOTE — Telephone Encounter (Signed)
Unable to contact Patient  Person you trying to reach is unavailable

## 2020-04-14 NOTE — Telephone Encounter (Signed)
It is fine for him to start the prednisone - should not affect his iron levels.  Algis Greenhouse. [name], MD 04/14/2020 9:39 AM

## 2020-04-14 NOTE — Telephone Encounter (Signed)
Pt is wanting instructions on his heavy metal testing sent through his mychart account. He can't be called today. He is requesting this be done before the weekend

## 2020-04-17 ENCOUNTER — Ambulatory Visit (INDEPENDENT_AMBULATORY_CARE_PROVIDER_SITE_OTHER): Payer: No Typology Code available for payment source | Admitting: Family Medicine

## 2020-04-17 ENCOUNTER — Telehealth: Payer: Self-pay | Admitting: Family Medicine

## 2020-04-17 ENCOUNTER — Other Ambulatory Visit: Payer: Self-pay

## 2020-04-17 ENCOUNTER — Other Ambulatory Visit (HOSPITAL_COMMUNITY): Payer: Self-pay | Admitting: Family Medicine

## 2020-04-17 ENCOUNTER — Encounter: Payer: Self-pay | Admitting: Family Medicine

## 2020-04-17 VITALS — BP 121/85 | HR 81 | Temp 98.5°F | Ht 73.0 in | Wt 165.6 lb

## 2020-04-17 DIAGNOSIS — R2990 Unspecified symptoms and signs involving the nervous system: Secondary | ICD-10-CM

## 2020-04-17 DIAGNOSIS — F419 Anxiety disorder, unspecified: Secondary | ICD-10-CM

## 2020-04-17 DIAGNOSIS — D508 Other iron deficiency anemias: Secondary | ICD-10-CM | POA: Diagnosis not present

## 2020-04-17 MED ORDER — DIAZEPAM 5 MG PO TABS: 7.5000 mg | ORAL_TABLET | Freq: Two times a day (BID) | ORAL | 1 refills | Status: DC | PRN

## 2020-04-17 MED ORDER — METANX 3-90.314-2-35 MG PO CAPS: ORAL_CAPSULE | ORAL | 5 refills | Status: DC

## 2020-04-17 MED FILL — L-METHYLFOLATE-ALGAE-B12-B6: 3-90.314-2- | 30 days supply | Qty: 30 | Fill #0

## 2020-04-17 NOTE — Patient Instructions (Signed)
It was very nice to see you today!  We will check blood work.  I will call you once results come back on this and  your urine sample.  I will Also check to see if we can get you to see neurology sooner.  Take care, Dr [name]  Please try these tips to maintain a healthy lifestyle:   Eat at least 3 REAL meals and 1-2 snacks per day.  Aim for no more than 5 hours between eating.  If you eat breakfast, please do so within one hour of getting up.    Each meal should contain half fruits/vegetables, one quarter protein, and one quarter carbs (no bigger than a computer mouse)   Cut down on sweet beverages. This includes juice, soda, and sweet tea.     Drink at least 1 glass of water with each meal and aim for at least 8 glasses per day   Exercise at least 150 minutes every week.

## 2020-04-17 NOTE — Assessment & Plan Note (Signed)
Stable.  Continue Valium 7.5 mg daily as needed.

## 2020-04-17 NOTE — Telephone Encounter (Signed)
Pt aware of procedure

## 2020-04-17 NOTE — Assessment & Plan Note (Addendum)
Symptoms are changing at the least not progressing to any worrisome signs or symptoms.  Work-up thus far is nondiagnostic.  Has heavy metal screen which is pending.  Will check ferritin and iron levels based on results of last peripheral smear.  He has referral to neurology pending and will likely need nerve conduction/EMG in the very near future.  Will check to see if we can get him worked in sooner as he is not currently scheduled to see them until another 6 weeks.  Patient symptoms started after receiving moderna Covid vaccine.  He is concerned about possible vaccine injury.

## 2020-04-17 NOTE — Telephone Encounter (Signed)
Pt has appointment today with PCP will explain procedure

## 2020-04-17 NOTE — Telephone Encounter (Signed)
Initial Comment Caller states he is supposed to do a 24 hour heavy metal test. He wants written instructions. Translation No Nurse Assessment Nurse: Laurena Bering, RN, Helene Kelp Date/Time Eilene Ghazi Time): 04/14/2020 7:02:09 PM Confirm and document reason for call. If symptomatic, describe symptoms. ---Caller states that he needs to do 24 hour urine test for heavy metals. Needs instructions Has the patient had close contact with a person known or suspected to have the novel coronavirus illness OR traveled / lives in area with major community spread (including international travel) in the last 14 days from the onset of symptoms? * If Asymptomatic, screen for exposure and travel within the last 14 days. ---No Does the patient have any new or worsening symptoms? ---No Please document clinical information provided and list any resource used. ---Per nursing knowledge, advised caller to void first AM void in toilet start time , collect urine for 24 hours and keep in refrigerator. Verbalized understanding Disp. Time Eilene Ghazi Time) Disposition Final User 04/14/2020 6:34:52 PM Send To RN Personal Richardson Landry, RN, Samantha 04/14/2020 7:05:01 PM Clinical Call Yes Laurena Bering, RN, Teres

## 2020-04-17 NOTE — Progress Notes (Signed)
   Scott Costa is a 29 y.o. male who presents today for an office visit.  Assessment/Plan:  Chronic Problems Addressed Today: Multiple neurological symptoms Symptoms are changing at the least not progressing to any worrisome signs or symptoms.  Work-up thus far is nondiagnostic.  Has heavy metal screen which is pending.  Will check ferritin and iron levels based on results of last peripheral smear.  He has referral to neurology pending and will likely need nerve conduction/EMG in the very near future.  Will check to see if we can get him worked in sooner as he is not currently scheduled to see them until another 6 weeks.  Patient symptoms started after receiving moderna Covid vaccine.  He is concerned about possible vaccine injury.  Anxiety Stable.  Continue Valium 7.5 mg daily as needed.    Subjective:  HPI:  Patient here for follow-up.  Was last seen about a week ago for multiple neurologic symptoms.We obtain additional labs at that time which were mostly unremarkable.  Did have a peripheral smear which showed concern for leukopenia and possible iron deficiency.  Overall his symptoms are relatively stable.  We started him on prednisone but this has not helped.  Still having twitching all over his body.  He has had a recurrence of numbness jellylike sensation in lower extremities.  He also has some increased tightness in left lower leg.  Still has tremor/fasciculations in upper extremity with arm extension.       Objective:  Physical Exam: BP 121/85   Pulse 81   Temp 98.5 F (36.9 C) (Temporal)   Ht 6\' 1"  (1.854 m)   Wt 165 lb 9.6 oz (75.1 kg)   SpO2 98%   BMI 21.85 kg/m   Gen: No acute distress, resting comfortably Psych: Normal affect and thought content  Time Spent: 47 minutes of total time was spent on the date of the encounter performing the following actions: chart review prior to seeing the patient, obtaining history, performing a medically necessary exam,  counseling on the treatment plan including neurology referral, placing orders, and documenting in our EHR.        Algis Greenhouse. [name], MD 04/17/2020 1:47 PM

## 2020-04-18 ENCOUNTER — Telehealth: Payer: Self-pay

## 2020-04-18 ENCOUNTER — Encounter: Payer: Self-pay | Admitting: Family Medicine

## 2020-04-18 LAB — IRON, TOTAL/TOTAL IRON BINDING CAP
%SAT: 24 % (calc) (ref 20–48)
Iron: 95 ug/dL (ref 50–180)
TIBC: 393 mcg/dL (calc) (ref 250–425)

## 2020-04-18 LAB — FERRITIN: Ferritin: 26 ng/mL — ABNORMAL LOW (ref 38–380)

## 2020-04-18 NOTE — Telephone Encounter (Signed)
Please place stat referral.  Algis Greenhouse. [name], MD 04/18/2020 4:15 PM

## 2020-04-18 NOTE — Telephone Encounter (Signed)
Please advise 

## 2020-04-18 NOTE — Telephone Encounter (Signed)
[facility] is requesting a stat referral for pt, per the pt.  Specify emg needed and clinicals need to be sent over that are relevant to the history of the issue. Pt wants stated in referral that he would like to see a provider that is familiar with Motor Neuron Disease and Guillian Barre Syndrome.

## 2020-04-19 ENCOUNTER — Other Ambulatory Visit: Payer: Self-pay | Admitting: *Deleted

## 2020-04-19 DIAGNOSIS — R2990 Unspecified symptoms and signs involving the nervous system: Secondary | ICD-10-CM

## 2020-04-19 MED ORDER — FERROUS SULFATE 325 (65 FE) MG PO TBEC: 325.0000 mg | DELAYED_RELEASE_TABLET | Freq: Every day | ORAL | 1 refills | Status: DC

## 2020-04-19 NOTE — Progress Notes (Signed)
Please inform patient of the following:  Has ferritin is low.  This is a lab that checks his iron stores.  It can be associated with some movement disorders.  I think it would be reasonable to start iron supplementation.  Recommend ferrous sulfate 325 mg daily with vitamin C on an empty stomach every other day.  We should recheck in 3 to 6 months.  Do not have a clear reason as to why he is low on iron.  Recommend GI referral to check for malabsorption or blood loss.

## 2020-04-19 NOTE — Telephone Encounter (Signed)
STAT referral place

## 2020-04-20 LAB — HEAVY METALS PROFILE, URINE
Arsenic, 24H Ur: <10 mcg/L (ref <=80)
Lead, 24 hr urine: <10 mcg/L (ref <80)
Mercury, 24H Ur: <4 mcg/L (ref <=20)

## 2020-04-20 NOTE — Telephone Encounter (Signed)
Pt has been contacted see phone note on 04/18/2021

## 2020-04-21 ENCOUNTER — Encounter: Payer: Self-pay | Admitting: Neurology

## 2020-04-21 ENCOUNTER — Encounter: Payer: Self-pay | Admitting: Family Medicine

## 2020-04-21 ENCOUNTER — Ambulatory Visit: Payer: No Typology Code available for payment source | Admitting: Neurology

## 2020-04-21 DIAGNOSIS — R6889 Other general symptoms and signs: Secondary | ICD-10-CM | POA: Diagnosis not present

## 2020-04-21 DIAGNOSIS — R202 Paresthesia of skin: Secondary | ICD-10-CM | POA: Diagnosis not present

## 2020-04-21 NOTE — Progress Notes (Signed)
Reason for visit: Sensory alteration, muscle twitches, tremor  Referring physician: Dr. [name] is a 29 y.o. male  History of present illness:  Scott Costa is a 29 year old right-handed white male with a history of sensory alteration following the Covid virus vaccinations.  His first vaccination was on 10 January 2020, following this, he had some malaise and a sensation of tightness in the right hand.  This seemed to improve but after the second vaccination on 07 February 2020, he again had malaise and fatigue and low-grade fever.  He then began to have tingling in his hands and feet and crawling sensations all over.  The sensory alteration began within the next day following the second vaccination.  The patient has had transient problems with lancinating pains that were throughout the body, but as time has gone on this has improved.  The patient has had a sensation that the left leg was heavy, this has again gradually improved over time.  The patient has had a slow sensation of movement in the right arm and a tight sensation around the midsection of the body with a sensation that he is having difficulty taking a deep breath.  He will have episodes of vibration sensation off and on throughout the body that migrates about, and he has had some ongoing problems with tingling that may affect the face, lips, mouth, and throat.  The tingling has been more persistent in the hands and feet.  He may have some occasional muscle cramps off and on throughout the body with twitches of the muscles that migrate about.  The patient has also developed within the last 2 weeks a tremor that occurs only when the hand is supinated to the vertical position and extended.  The tremor is worse with the right arm than the left.  The tremor is not seen with other arm movements.  The patient has undergone an extensive evaluation to include MRI of the brain, cervical spine, thoracic spine, and lumbar spine, all of the  studies were normal.  The patient has had extensive blood work done, the only abnormality was a low ferritin level.  The patient has had blood work for magnesium, ceruloplasmin, CBC, Lyme antibody panel, ANA, CK enzyme level, sedimentation rate, C-reactive protein, and TSH.  All of the studies have been unremarkable.  He is sent to this office for an evaluation.  Past Medical History:  Diagnosis Date  . Fainting spell    Due to blood draws  . Food allergy   . GERD (gastroesophageal reflux disease)   . Insomnia     Past Surgical History:  Procedure Laterality Date  . LIPOMA EXCISION    . MOLE REMOVAL    . UPPER GI ENDOSCOPY  12/06/2019    Family History  Problem Relation Age of Onset  . Allergic rhinitis Mother   . Hypertension Father   . Colon cancer Paternal Grandfather 45  . Emphysema Paternal Grandmother   . Esophageal cancer Neg Hx   . Stomach cancer Neg Hx   . Rectal cancer Neg Hx   . Asthma Neg Hx   . Eczema Neg Hx   . Urticaria Neg Hx   . Immunodeficiency Neg Hx   . Angioedema Neg Hx   . Atopy Neg Hx     Social history:  reports that he quit smoking about 7 years ago. He started smoking about 15 years ago. He has a 2.00 pack-year smoking history. He has never used smokeless  tobacco. He reports previous alcohol use. He reports that he does not use drugs.  Medications:  Prior to Admission medications   Medication Sig Start Date End Date Taking? Authorizing Provider  cetirizine (ZYRTEC) 10 MG tablet Take 10 mg by mouth daily.    Yes [provider]  diazepam (VALIUM) 5 MG tablet Take 1.5 tablets (7.5 mg total) by mouth every 12 (twelve) hours as needed for anxiety. 04/17/20  Yes [name], MD  EPINEPHrine 0.3 mg/0.3 mL IJ SOAJ injection Use as directed for severe allergic reactions 01/31/20  Yes Ambs, Kathrine Cords, FNP  ferrous sulfate 325 (65 FE) MG EC tablet Take 1 tablet (325 mg total) by mouth daily with breakfast. 04/19/20 05/19/20 Yes [name], MD    L-Methylfolate-Algae-B12-B6 [facility]) 3-90.314-2-35 MG CAPS Take 1 capsule daily. 04/17/20  Yes [name], MD  montelukast (SINGULAIR) 10 MG tablet Take 1 tablet once a day to prevent coughing or wheezing Patient taking differently: Take 10 mg by mouth daily.  12/28/19  Yes Bardelas, Jens Som, MD  triamcinolone cream (KENALOG) 0.1 % Use twice a day if needed to red itchy areas below the face 12/28/19  Yes Bardelas, Jens Som, MD  fluticasone (FLONASE) 50 MCG/ACT nasal spray Place 2 sprays into both nostrils daily as needed (for stuffy nose). Patient not taking: Reported on 04/21/2020 12/28/19   Charlies Silvers, MD      Allergies  Allergen Reactions  . Shellfish Allergy Anaphylaxis  . Nickel     ROS:  Out of a complete 14 system review of symptoms, the patient complains only of the following symptoms, and all other reviewed systems are negative.  Sensory alterations Tremor Muscle twitches  Blood pressure 125/83, pulse 74, height 6' (1.829 m), weight 165 lb (74.8 kg).  Physical Exam  General: The patient is alert and cooperative at the time of the examination.  Eyes: Pupils are equal, round, and reactive to light. Discs are flat bilaterally.  Neck: The neck is supple, no carotid bruits are noted.  Respiratory: The respiratory examination is clear.  Cardiovascular: The cardiovascular examination reveals a regular rate and rhythm, no obvious murmurs or rubs are noted.  Skin: Extremities are without significant edema.  Neurologic Exam  Mental status: The patient is alert and oriented x 3 at the time of the examination. The patient has apparent normal recent and remote memory, with an apparently normal attention span and concentration ability.  Cranial nerves: Facial symmetry is present. There is good sensation of the face to pinprick and soft touch bilaterally. The strength of the facial muscles and the muscles to head turning and shoulder shrug are normal bilaterally. Speech is  well enunciated, no aphasia or dysarthria is noted. Extraocular movements are full. Visual fields are full. The tongue is midline, and the patient has symmetric elevation of the soft palate. No obvious hearing deficits are noted.  Motor: The motor testing reveals 5 over 5 strength of all 4 extremities. Good symmetric motor tone is noted throughout.  Sensory: Sensory testing is intact to pinprick, soft touch, vibration sensation, and position sense on all 4 extremities. No evidence of extinction is noted.  Coordination: Cerebellar testing reveals good finger-nose-finger and heel-to-shin bilaterally.  Gait and station: Gait is normal. Tandem gait is normal. Romberg is negative. No drift is seen.  Reflexes: Deep tendon reflexes are symmetric and normal bilaterally. Toes are downgoing bilaterally.   Assessment/Plan:  1.  Multiple somatic complaints  The patient has a multitude  of sensory and motor complaints following the second Covid virus vaccination.  Certainly, individuals may sometimes experience transient sensory alterations with tingling after the vaccination, the patient seems to have some persistent tingling that may migrate about the body.  The clinical examination is completely normal.  The patient has an unusual tremor that is associated with extension of the arm, right greater than left, only when the hand is in a vertical plane.  It is possible this may represent a fictitious tremor.  Basically, the neurologic examination is completely normal.  I do not believe that EMG and nerve conduction study evaluation is likely to be of any diagnostic value.  We will follow the patient over time conservatively, I suspect he will improve as time goes on.  We will see him back in 4 months.   Scott Alexanders MD 04/21/2020 11:25 AM  [facility] [address] [place], [place] [address]  Phone [phone] Fax [phone]

## 2020-04-24 ENCOUNTER — Encounter: Payer: Self-pay | Admitting: Family Medicine

## 2020-04-24 DIAGNOSIS — K219 Gastro-esophageal reflux disease without esophagitis: Secondary | ICD-10-CM

## 2020-04-24 DIAGNOSIS — K909 Intestinal malabsorption, unspecified: Secondary | ICD-10-CM

## 2020-04-24 MED FILL — diazePAM 5 MG TABS: 5 | 15 days supply | Qty: 30 | Fill #1

## 2020-04-24 NOTE — Telephone Encounter (Signed)
FYI Patient information from Dr Jannifer Franklin

## 2020-04-26 NOTE — Telephone Encounter (Signed)
Ok to place referral.

## 2020-05-04 MED FILL — MONTELUKAST SOD 10 MG TAB: 10 | 30 days supply | Qty: 30 | Fill #3

## 2020-05-07 ENCOUNTER — Encounter: Payer: Self-pay | Admitting: Family Medicine

## 2020-05-09 ENCOUNTER — Other Ambulatory Visit: Payer: Self-pay | Admitting: Family Medicine

## 2020-05-09 MED ORDER — DIAZEPAM 5 MG PO TABS: 7.5000 mg | ORAL_TABLET | Freq: Two times a day (BID) | ORAL | 1 refills | Status: DC | PRN

## 2020-05-09 MED FILL — diazePAM 5 MG TABS: 5 | 30 days supply | Qty: 90 | Fill #0

## 2020-05-22 ENCOUNTER — Other Ambulatory Visit: Payer: Self-pay

## 2020-05-22 ENCOUNTER — Encounter: Payer: Self-pay | Admitting: Family Medicine

## 2020-05-22 DIAGNOSIS — R2990 Unspecified symptoms and signs involving the nervous system: Secondary | ICD-10-CM

## 2020-05-22 MED FILL — L-METHYLFOLATE-ALGAE-B12-B6: 3-90.314-2- | 30 days supply | Qty: 30 | Fill #1

## 2020-05-26 ENCOUNTER — Telehealth: Payer: Self-pay

## 2020-05-26 NOTE — Telephone Encounter (Signed)
Patient is following up regarding this document. Patient is also going to bring paperwork on Monday is needed by 10/27 due to flu exemption  For [employer]

## 2020-05-26 NOTE — Telephone Encounter (Signed)
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

## 2020-05-29 ENCOUNTER — Ambulatory Visit (INDEPENDENT_AMBULATORY_CARE_PROVIDER_SITE_OTHER): Payer: No Typology Code available for payment source | Admitting: Nurse Practitioner

## 2020-05-29 ENCOUNTER — Other Ambulatory Visit: Payer: No Typology Code available for payment source

## 2020-05-29 ENCOUNTER — Encounter: Payer: Self-pay | Admitting: Nurse Practitioner

## 2020-05-29 VITALS — BP 100/70 | HR 80 | Ht 72.0 in | Wt 167.5 lb

## 2020-05-29 DIAGNOSIS — E611 Iron deficiency: Secondary | ICD-10-CM | POA: Diagnosis not present

## 2020-05-29 NOTE — Telephone Encounter (Signed)
Please advise 

## 2020-05-29 NOTE — Telephone Encounter (Signed)
Error

## 2020-05-29 NOTE — Telephone Encounter (Signed)
Ok to write letter stating he should be exempt from flu vaccine.  Algis Greenhouse. [name], MD 05/29/2020 8:57 AM

## 2020-05-29 NOTE — Progress Notes (Signed)
ASSESSMENT AND PLAN     # Iron deficiency without anemia. Discovered during extensive workup for multiple neurologic symptoms following COVID vaccination. Peripheral smear in September suggests iron deficiency with microcytic / hypochromic RBCs. Ferritin 26. Hgb normal at 14.3 --Iron deficiency is certainly not normal in this 29 year old male. Though small bowel looked normal on EGD in May 2021 ( done for GERD symptoms), will check  tTg, IgA for celiac disease --Though many of his initial neurologic symptoms have resolved, he complains of residual muscle twitching / tremors / tingling. Doubtful that his symptoms are neurologic manifestations of celiac disease, even it he turns out to have it.  --No bowel changes or blood in stool but still recommend colonoscopy to evaluate for polyps / neoplasm. Patient wants to wait on celiac studies before pursing colonoscopy. Will let me know results of celiac studies when available. --PCP has started him on oral iron which is fine unless turns out to have celiac    # Mild leukopenia. Patient has concerns about WBC of 3.4. I explained that the count was just below what is considered normal. I don't have an answer for this. I don't think it is post viral since he had COVID back in December and WBC was normal in August 2021. His platelets are okay  # Anxiety --currently being managed with Valium  HISTORY OF PRESENT ILLNESS     Primary Gastroenterologist : Wilfrid Lund, MD  Chief Complaint : iron deficiency  Scott Costa is a 29 y.o. male with PMH / [place] significant for,  but not necessarily limited to: anxiety, Covid infection December 2020   Scott Costa is here for evaluation of iron deficiency. Patient had COVID19 late Dec 2020, he was ill for a few weeks but recovered. He received first injection of Moderna vaccine in June 2021 and second dose 02/07/20. After the first dose he developed what he describes as "slowness" or tightness in hands similar  to when hands are freezing cold and they don't move well. After the second dose he had severe malaise. Then he developed generalized tingling / crawling sensations and pain, especially in his upper and lower extremities. His abdomen felt tight and he had frequent generalized twitching. He had unremarkable MRI of brain, cervical spine, thoracic spine and lumbar spine. Multiple labs including CRP, ESR, B12, TSH, ceruloplasmin, Lyme antibodies, CK levels, etc were unrevealing.  However, his ferritin was low at 26. Some of his symptoms improved with time. He saw Neurology mid September. Neuro exam was unremarkable. Further neurologic workup wasn't felt warranted. He was asked to follow up in 4 months. He has some residual symptoms such as intermittent tightness and weakness in distal LLE. He still has frequent, generalized twitching. He has RUE twitching / tremors upon extending right arm. Patient thinks he had mild Guilain Barre Syndrome. Patient says that before the vaccinations he was very healthy. He is an avid Museum/gallery curator.   Patient referred for evaluation of iron deficiency without anemia. He hasn't had any rectal bleeding or bowel changes. No NSAID use. He doesn't donate blood. Paternal grandfather had paternal colon cancer at age 81. No abdominal pain or bowel changes.   04/11/20 WBC 3.4 hgb 14.3 Platelets 234.   Previous Endoscopic Evaluations / Pertinent Studies:  none   Past Medical History:  Diagnosis Date  . Fainting spell    Due to blood draws  . Food allergy   . GERD (gastroesophageal reflux disease)   . Insomnia   .  Leukopenia   . Low ferritin level      Past Surgical History:  Procedure Laterality Date  . LIPOMA EXCISION    . MOLE REMOVAL    . UPPER GI ENDOSCOPY  12/06/2019   Family History  Problem Relation Age of Onset  . Allergic rhinitis Mother   . Hypertension Father   . Colon cancer Paternal Grandfather 74  . Emphysema Paternal Grandmother   . Esophageal cancer Neg Hx    . Stomach cancer Neg Hx   . Rectal cancer Neg Hx   . Asthma Neg Hx   . Eczema Neg Hx   . Urticaria Neg Hx   . Immunodeficiency Neg Hx   . Angioedema Neg Hx   . Atopy Neg Hx    Social History   Tobacco Use  . Smoking status: Former Smoker    Packs/day: 0.40    Years: 5.00    Pack years: 2.00    Start date: 2006    Quit date: 2014    Years since quitting: 7.8  . Smokeless tobacco: Never Used  Vaping Use  . Vaping Use: Never used  Substance Use Topics  . Alcohol use: Not Currently    Comment: occasional; hasn't had a drink since about aug 2021, worsens symptoms   . Drug use: Never   Current Outpatient Medications  Medication Sig Dispense Refill  . cetirizine (ZYRTEC) 10 MG tablet Take 10 mg by mouth daily.     . diazepam (VALIUM) 5 MG tablet Take 1.5 tablets (7.5 mg total) by mouth every 12 (twelve) hours as needed for anxiety. 90 tablet 1  . EPINEPHrine 0.3 mg/0.3 mL IJ SOAJ injection Use as directed for severe allergic reactions 2 each 1  . fluticasone (FLONASE) 50 MCG/ACT nasal spray Place 2 sprays into both nostrils daily as needed (for stuffy nose). 16 g 5  . L-Methylfolate-Algae-B12-B6 (METANX) 3-90.314-2-35 MG CAPS Take 1 capsule daily. 30 capsule 5  . montelukast (SINGULAIR) 10 MG tablet Take 1 tablet once a day to prevent coughing or wheezing (Patient taking differently: Take 10 mg by mouth daily. ) 30 tablet 5  . triamcinolone cream (KENALOG) 0.1 % Use twice a day if needed to red itchy areas below the face 45 g 3  . ferrous sulfate 325 (65 FE) MG EC tablet Take 1 tablet (325 mg total) by mouth daily with breakfast. 30 tablet 1   No current facility-administered medications for this visit.   Allergies  Allergen Reactions  . Shellfish Allergy Anaphylaxis  . Nickel      Review of Systems:  All systems reviewed and negative except where noted in HPI.   PHYSICAL EXAM :    Wt Readings from Last 3 Encounters:  05/29/20 167 lb 8 oz (76 kg)  04/21/20 165 lb  (74.8 kg)  04/17/20 165 lb 9.6 oz (75.1 kg)    BP 100/70 (BP Location: Left Arm, Patient Position: Sitting, Cuff Size: Normal)   Pulse 80   Ht 6' (1.829 m)   Wt 167 lb 8 oz (76 kg)   BMI 22.72 kg/m  Constitutional:  Pleasant thin male in no acute distress. Psychiatric: Normal mood and affect. Behavior is normal. EENT: Pupils normal.  Conjunctivae are normal. No scleral icterus. Neck supple.  Cardiovascular: Normal rate, regular rhythm. No edema Pulmonary/chest: Effort normal and breath sounds normal. No wheezing, rales or rhonchi. Abdominal: Soft, nondistended, nontender. Bowel sounds active throughout. There are no masses palpable. No hepatomegaly. Neurological: Alert and oriented to person  place and time. Skin: Skin is warm and dry. No rashes noted.  Tye Savoy, NP  05/29/2020, 10:51 AM  Cc:   [name], MD

## 2020-05-29 NOTE — Telephone Encounter (Signed)
Patient is calling in asking for an update, states he left a form for Dr.Parker to sign earlier this morning.

## 2020-05-29 NOTE — Patient Instructions (Signed)
If you are age 31 or older, your body mass index should be between 23-30. Your Body mass index is 22.72 kg/m. If this is out of the aforementioned range listed, please consider follow up with your Primary Care Provider.  If you are age 31 or younger, your body mass index should be between 19-25. Your Body mass index is 22.72 kg/m. If this is out of the aformentioned range listed, please consider follow up with your Primary Care Provider.   It has been recommended to you by your physician that you have a(n) Colonoscopy completed. Per your request, we did not schedule the procedure(s) today. Please contact our office at [phone] should you decide to have the procedure completed. You will be scheduled for a pre-visit and procedure at that time.  Your provider has requested that you go to the basement level for lab work before leaving today. Press "B" on the elevator. The lab is located at the first door on the left as you exit the elevator.  Due to recent changes in healthcare laws, you may see the results of your imaging and laboratory studies on MyChart before your provider has had a chance to review them.  We understand that in some cases there may be results that are confusing or concerning to you. Not all laboratory results come back in the same time frame and the provider may be waiting for multiple results in order to interpret others.  Please give [name] 48 hours in order for your provider to thoroughly review all the results before contacting the office for clarification of your results.   We will call you with the results of your labs.  Follow up pending at this time.

## 2020-05-30 LAB — TISSUE TRANSGLUTAMINASE, IGA: (tTG) Ab, IgA: <1 U/mL

## 2020-05-30 LAB — IGA: Immunoglobulin A: 137 mg/dL (ref 47–310)

## 2020-05-30 NOTE — Telephone Encounter (Signed)
Form at front office

## 2020-05-31 ENCOUNTER — Other Ambulatory Visit: Payer: Self-pay

## 2020-05-31 DIAGNOSIS — E611 Iron deficiency: Secondary | ICD-10-CM

## 2020-05-31 NOTE — Progress Notes (Signed)
____________________________________________________________  Attending physician addendum:  Thank you for sending this case to me. I have reviewed the entire note and his recent labs.  Mildly decreased ferritin, normal iron level and iron saturation and hemoglobin.  No localizing GI symptoms, no overt bleeding.  Ttg neg and total Iga nml He does not appear to need a colonoscopy at this point. Recommend stool check for iFOBT.  If pos -> colonoscopy.  If neg, no colonoscopy.  Wilfrid Lund, MD  ____________________________________________________________

## 2020-06-02 ENCOUNTER — Encounter: Payer: Self-pay | Admitting: Neurology

## 2020-06-02 ENCOUNTER — Other Ambulatory Visit: Payer: Self-pay

## 2020-06-02 ENCOUNTER — Ambulatory Visit (INDEPENDENT_AMBULATORY_CARE_PROVIDER_SITE_OTHER): Payer: No Typology Code available for payment source | Admitting: Neurology

## 2020-06-02 ENCOUNTER — Ambulatory Visit: Payer: No Typology Code available for payment source | Admitting: Neurology

## 2020-06-02 VITALS — BP 137/88 | HR 98 | Ht 72.0 in | Wt 168.0 lb

## 2020-06-02 DIAGNOSIS — R6889 Other general symptoms and signs: Secondary | ICD-10-CM

## 2020-06-02 DIAGNOSIS — F430 Acute stress reaction: Secondary | ICD-10-CM | POA: Diagnosis not present

## 2020-06-02 NOTE — Patient Instructions (Signed)
Your neurological exam is entirely normal, making any neurological disorder very unlikely.  These symptoms maybe stress reaction and I encourage you to take time to work on this.    I will see you back in 6 months

## 2020-06-02 NOTE — Progress Notes (Signed)
[name] Clinic Note - Initial Visit   Date: 06/02/20  Scott Costa MRN: [id number] DOB: 10-30-1990   Dear Dr. [name]:  Thank you for your kind referral of Scott Costa for consultation of paresthesias, muscle twitching, and tremor. Although his history is well known to you, please allow [name] to reiterate it for the purpose of our medical record. The patient was accompanied to the clinic by self.    History of Present Illness: Scott Costa is a 29 y.o. right-handed male with iron deficiency, anxiety, COVID infection (07/2019) presenting for evaluation of paresthesias, muscle twitching, and abnormal movements. He had COVID19 infection in December 2020 from which he recovered over the next 1.5 months.  He did not require hospitalization or monoclonal antibody.  He had his first [place] vaccination on June 7th which was followed by malaise and tight sensation in the hands, as if it was very cold.  This slowly improved.  Following his second vaccination on 02/07/20, he developed malaise, low-grade fever, vibration/tingling in the hands/feet and crawling sensation. Over the next day, de also had whole body sharp pain, muscle cramps, generalized twitching, hand tremors. He also has heaviness of the left leg, but was able to ambulate.  Over the past several months, many of his symptoms have improved.  He continues to have intermittent paresthesias of the hands and feet, abnormal right hand tremor, and generalized twitching.  He has not suffered falls.  He is an avid hiker and hikes 15-20 miles over the weekend. He has done significant research on his symptoms and feels that he may have Guillain-Barre variant.   Prior testing has been extensive and includes MRI brain, cervical spine, thoracic spine, and lumbar spine which were normal.  Laboratory testing has included magnesium, vitamin B12, ceruloplasmin, Lyme antibody, ANA, CK, ESR, CRP, TSH, heavy  metal screen, and celiac antibody testing which has been normal.  The only notable findings on lab was consistent with iron deficient anemia.  He has seen GI for this and started on supplementation.  He was evaluated by Dr. Jannifer Franklin at [facility] in September who noted normal neurological exam and suggested that symptoms should improve with time. He is here for second opinion.  He works as a Technical brewer at [employer].  Out-side paper records, electronic medical record, and images have been reviewed where available and summarized as:   Lab Results  Component Value Date   VITAMINB12 450 03/07/2020   Lab Results  Component Value Date   TSH 1.51 03/07/2020   Lab Results  Component Value Date   ESRSEDRATE 2 04/05/2020    Past Medical History:  Diagnosis Date  . Fainting spell    Due to blood draws  . Food allergy   . GERD (gastroesophageal reflux disease)   . Insomnia   . Leukopenia   . Low ferritin level     Past Surgical History:  Procedure Laterality Date  . LIPOMA EXCISION    . MOLE REMOVAL    . UPPER GI ENDOSCOPY  12/06/2019     Medications:  Outpatient Encounter Medications as of 06/02/2020  Medication Sig  . Ascorbic Acid (VITAMIN C) 1000 MG tablet Take 1,000 mg by mouth daily.  . diazepam (VALIUM) 5 MG tablet Take 1.5 tablets (7.5 mg total) by mouth every 12 (twelve) hours as needed for anxiety.  Marland Kitchen EPINEPHrine 0.3 mg/0.3 mL IJ SOAJ injection Use as directed for severe allergic reactions  . ferrous sulfate 325 (65 FE)  MG EC tablet Take 1 tablet (325 mg total) by mouth daily with breakfast.  . fluticasone (FLONASE) 50 MCG/ACT nasal spray Place 2 sprays into both nostrils daily as needed (for stuffy nose).  Marland Kitchen L-Methylfolate-Algae-B12-B6 (METANX) 3-90.314-2-35 MG CAPS Take 1 capsule daily.  . montelukast (SINGULAIR) 10 MG tablet Take 1 tablet once a day to prevent coughing or wheezing (Patient taking differently: Take 10 mg by mouth daily. )  . triamcinolone cream (KENALOG) 0.1 %  Use twice a day if needed to red itchy areas below the face  . cetirizine (ZYRTEC) 10 MG tablet Take 10 mg by mouth daily.  (Patient not taking: Reported on 06/02/2020)   No facility-administered encounter medications on file as of 06/02/2020.    Allergies:  Allergies  Allergen Reactions  . Shellfish Allergy Anaphylaxis  . Nickel     Family History: Family History  Problem Relation Age of Onset  . Allergic rhinitis Mother   . Hypertension Father   . Colon cancer Paternal Grandfather 76  . Emphysema Paternal Grandmother   . Esophageal cancer Neg Hx   . Stomach cancer Neg Hx   . Rectal cancer Neg Hx   . Asthma Neg Hx   . Eczema Neg Hx   . Urticaria Neg Hx   . Immunodeficiency Neg Hx   . Angioedema Neg Hx   . Atopy Neg Hx     Social History: Social History   Tobacco Use  . Smoking status: Former Smoker    Packs/day: 0.40    Years: 5.00    Pack years: 2.00    Start date: 2006    Quit date: 2014    Years since quitting: 7.8  . Smokeless tobacco: Never Used  Vaping Use  . Vaping Use: Never used  Substance Use Topics  . Alcohol use: Not Currently    Comment: occasional; hasn't had a drink since about aug 2021, worsens symptoms   . Drug use: Never   Social History   Social History Narrative   Lives at home with partner    Right handed   Caffeine: none currently    Vital Signs:  BP 137/88   Pulse 98   Ht 6' (1.829 m)   Wt 168 lb (76.2 kg)   SpO2 100%   BMI 22.78 kg/m    Neurological Exam: MENTAL STATUS including orientation to time, place, person, recent and remote memory, attention span and concentration, language, and fund of knowledge is normal.  Speech is not dysarthric.  CRANIAL NERVES: II:  No visual field defects.  Unremarkable fundi.   III-IV-VI: Pupils equal round and reactive to light.  Normal conjugate, extra-ocular eye movements in all directions of gaze.  No nystagmus.  No ptosis.   V:  Normal facial sensation.    VII:  Normal facial  symmetry and movements.   VIII:  Normal hearing and vestibular function.   IX-X:  Normal palatal movement.   XI:  Normal shoulder shrug and head rotation.   XII:  Normal tongue strength and range of motion, no deviation or fasciculation.  MOTOR:  No atrophy, fasciculations or abnormal movements.  No pronator drift.   Patient is voluntarily able to elicit tremor by showing me when he extends and flexes the tight arm from a 90 degree position, his hand will waver side-to-side in moderate amplitude, low frequency. This movement is distractible.  There is no specific tremor that I can appreciate.   Upper Extremity:  Right  Left  Deltoid  5/5  5/5   Biceps  5/5   5/5   Triceps  5/5   5/5   Infraspinatus 5/5  5/5  Medial pectoralis 5/5  5/5  Wrist extensors  5/5   5/5   Wrist flexors  5/5   5/5   Finger extensors  5/5   5/5   Finger flexors  5/5   5/5   Dorsal interossei  5/5   5/5   Abductor pollicis  5/5   5/5   Tone (Ashworth scale)  0  0   Lower Extremity:  Right  Left  Hip flexors  5/5   5/5   Hip extensors  5/5   5/5   Adductor 5/5  5/5  Abductor 5/5  5/5  Knee flexors  5/5   5/5   Knee extensors  5/5   5/5   Dorsiflexors  5/5   5/5   Plantarflexors  5/5   5/5   Toe extensors  5/5   5/5   Toe flexors  5/5   5/5   Tone (Ashworth scale)  0  0   MSRs:  Right        Left                  brachioradialis 2+  2+  biceps 2+  2+  triceps 2+  2+  patellar 2+  2+  ankle jerk 2+  2+  Hoffman no  no  plantar response down  down   SENSORY:  Normal and symmetric perception of light touch, pinprick, vibration, and proprioception.  Romberg's sign absent.   COORDINATION/GAIT: Normal finger-to- nose-finger and heel-to-shin.  Intact rapid alternating movements bilaterally.  Able to rise from a chair without using arms.  Gait narrow based and stable. Tandem and stressed gait intact.    IMPRESSION: Migratory paresthesias, muscle twitches, and nonphysiological right hand movement.   -  Patient has a normal exam and extensive neurological evaluation which is very reassuring.    - Discussed at length that he does not have any neuromuscular disorder (ie. GBS, CIDP, MND, etc) which would explain symptoms and symptoms maybe moreso a manifestation of stress reaction.   - Explained that electrodiagnostic testing is likely to return normal in the setting of a normal neurological exam, and therefore unlikely to change management, which he was in agreement with  - Counseled patient to avoid researching symptoms online as this can only exacerbate anxiety/stress  - Symptoms should continue to improve over time, if not, NCS/EMG can be ordered.   - If he develops ongoing migratory symptoms, I also mentioned that seeing a counselor for coping mechanisms can be helpful.   Total time spent reviewing records, interview, history/exam, documentation, and coordination of care on day of encounter:  60 min   Thank you for allowing me to participate in patient's care.  If I can answer any additional questions, I would be pleased to do so.    Sincerely,    Donika K. Posey Pronto, DO

## 2020-06-07 MED FILL — MONTELUKAST SOD 10 MG TAB: 10 | 30 days supply | Qty: 30 | Fill #4

## 2020-06-19 ENCOUNTER — Encounter: Payer: Self-pay | Admitting: Family Medicine

## 2020-06-19 NOTE — Telephone Encounter (Signed)
Please advise 

## 2020-06-22 MED FILL — L-METHYLFOLATE-ALGAE-B12-B6: 3-90.314-2- | 30 days supply | Qty: 30 | Fill #2

## 2020-07-11 ENCOUNTER — Encounter: Payer: Self-pay | Admitting: Family Medicine

## 2020-07-13 ENCOUNTER — Other Ambulatory Visit: Payer: Self-pay

## 2020-07-13 DIAGNOSIS — D509 Iron deficiency anemia, unspecified: Secondary | ICD-10-CM

## 2020-07-13 MED FILL — MONTELUKAST SOD 10 MG TAB: 10 | 30 days supply | Qty: 30 | Fill #5

## 2020-07-17 ENCOUNTER — Other Ambulatory Visit: Payer: No Typology Code available for payment source

## 2020-07-17 ENCOUNTER — Other Ambulatory Visit: Payer: Self-pay

## 2020-07-17 DIAGNOSIS — D509 Iron deficiency anemia, unspecified: Secondary | ICD-10-CM

## 2020-07-18 LAB — CBC WITH DIFFERENTIAL/PLATELET
Absolute Monocytes: 310 cells/uL (ref 200–950)
Basophils Absolute: 30 cells/uL (ref 0–200)
Basophils Relative: 0.6 %
Eosinophils Absolute: 90 cells/uL (ref 15–500)
Eosinophils Relative: 1.8 %
HCT: 44.4 % (ref 38.5–50.0)
Hemoglobin: 14.9 g/dL (ref 13.2–17.1)
Lymphs Abs: 2215 cells/uL (ref 850–3900)
MCH: 27.4 pg (ref 27.0–33.0)
MCHC: 33.6 g/dL (ref 32.0–36.0)
MCV: 81.6 fL (ref 80.0–100.0)
MPV: 9.9 fL (ref 7.5–12.5)
Monocytes Relative: 6.2 %
Neutro Abs: 2355 cells/uL (ref 1500–7800)
Neutrophils Relative %: 47.1 %
Platelets: 260 10*3/uL (ref 140–400)
RBC: 5.44 10*6/uL (ref 4.20–5.80)
RDW: 13 % (ref 11.0–15.0)
Total Lymphocyte: 44.3 %
WBC: 5 10*3/uL (ref 3.8–10.8)

## 2020-07-18 LAB — IRON: Iron: 157 ug/dL (ref 50–180)

## 2020-07-18 LAB — FERRITIN: Ferritin: 127 ng/mL (ref 38–380)

## 2020-07-18 LAB — PATHOLOGIST SMEAR REVIEW

## 2020-07-18 NOTE — Progress Notes (Signed)
Please inform patient of the following:  Iron better than last time. Everything else is stable.  Scott Costa. [name], MD 07/18/2020 4:21 PM

## 2020-07-19 MED FILL — diazePAM 5 MG TABS: 5 | 30 days supply | Qty: 90 | Fill #1

## 2020-07-25 ENCOUNTER — Encounter: Payer: Self-pay | Admitting: Family Medicine

## 2020-07-25 NOTE — Telephone Encounter (Signed)
Please advise 

## 2020-07-26 NOTE — Telephone Encounter (Signed)
Patient is following up regarding this he is asking if he needs to continue taking his iron supplements or if theres something else he should be doing. Please advise  Patient would prefer MY-CHART MESSAGE with detailed answer please.

## 2020-08-01 MED FILL — L-METHYLFOLATE-ALGAE-B12-B6: 3-90.314-2- | 30 days supply | Qty: 30 | Fill #3

## 2020-08-07 ENCOUNTER — Ambulatory Visit: Payer: No Typology Code available for payment source | Admitting: Neurology

## 2020-08-15 ENCOUNTER — Other Ambulatory Visit: Payer: Self-pay

## 2020-08-15 ENCOUNTER — Encounter: Payer: Self-pay | Admitting: Family Medicine

## 2020-08-15 ENCOUNTER — Other Ambulatory Visit: Payer: Self-pay | Admitting: Family Medicine

## 2020-08-15 MED ORDER — MONTELUKAST SODIUM 10 MG PO TABS: 10.0000 mg | ORAL_TABLET | Freq: Every day | ORAL | 0 refills | Status: DC

## 2020-08-15 MED FILL — MONTELUKAST SOD 10 MG TAB: 10 | 30 days supply | Qty: 30 | Fill #0

## 2020-08-16 NOTE — Telephone Encounter (Signed)
We do need to do a 6 month follow up on patients with asthma. Thank you

## 2020-08-16 NOTE — Telephone Encounter (Signed)
   I can do a televisit only if it is clinically required to continue this medication. Otherwise I have no side effects, no concerns and no questions concerning the medication. My condition is unchanged and my symptoms return if I miss a day or two of montelukast. If the physician or FNP still needs to visit with me please let me know and we can schedule a virtual visit. Otherwise Id rather save the time and money. Thank you

## 2020-08-29 ENCOUNTER — Other Ambulatory Visit: Payer: Self-pay

## 2020-08-29 ENCOUNTER — Telehealth: Payer: Self-pay | Admitting: Neurology

## 2020-08-29 DIAGNOSIS — R202 Paresthesia of skin: Secondary | ICD-10-CM

## 2020-08-29 NOTE — Telephone Encounter (Signed)
If he is requesting all four extremities, then he will need two separate EMG visits - one for bilateral arms and another for bilateral legs.

## 2020-08-29 NOTE — Telephone Encounter (Addendum)
Scott Costa called and scheduled an EMG for 09/26/20 at 10:15 AM. He said he wanted to be sure Dr. Posey Pronto is aware he needs both his RUE and LLE tested.   Will this need to be two separate visits?  Need an order entered please.

## 2020-08-29 NOTE — Telephone Encounter (Signed)
I scheduled Right arm and right leg, is this correct?

## 2020-08-29 NOTE — Telephone Encounter (Signed)
Called patient and left a message to call back for scheduling EMG's.

## 2020-08-30 NOTE — Telephone Encounter (Signed)
L/M for patient to call back to schedule.

## 2020-09-05 MED FILL — L-METHYLFOLATE-ALGAE-B12-B6: 3-90.314-2- | 30 days supply | Qty: 30 | Fill #4

## 2020-09-11 NOTE — Patient Instructions (Addendum)
Asthma Continue montelukast 10 mg-take 1 tablet once a day to prevent coughing or wheezing Continue albuterol HFA-2 puffs every 4 hours if needed for coughing or wheezing.   You may use albuterol 2 puffs 5 to 15 minutes before exercise .  Follow up with chest CT as recommended by your primary care provider  Allergic rhinitis Continue allergen avoidance measures directed toward grass pollen, weed pollen, tree pollen, dust mite, cockroach, and cat as listed below Continue Zyrtec 10 mg-take 1 tablet once a day if needed for runny nose or itchy eyes Continue Flonase 2 sprays per nostril once a day if needed for stuffy nose.  In the right nostril, point the applicator out toward the right ear. In the left nostril, point the applicator out toward the left ear Consider saline nasal rinses as needed for nasal symptoms. Use this before any medicated nasal sprays for best result  Allergic conjunctivitis Continue Visine AC-1 drop 3 times a day if needed for itchy eyes. Some over the counter eye drops include Pataday one drop in each eye once a day as needed for red, itchy eyes OR Zaditor one drop in each eye twice a day as needed for red itchy eyes.  Atopic dermaitits Continue a daily moisturizing routine  Food allergy Continue to avoid shellfish. In case of an allergic reaction, take Benadryl 50 mg every 4 hours, and if life-threatening symptoms occur, inject with EpiPen 0.3 mg.  Call us if you are not doing well on this treatment plan  Follow up in 6 months or sooner if needed.  Reducing Pollen Exposure The American Academy of Allergy, Asthma and Immunology suggests the following steps to reduce your exposure to pollen during allergy seasons. 1. Do not hang sheets or clothing out to dry; pollen may collect on these items. 2. Do not mow lawns or spend time around freshly cut grass; mowing stirs up pollen. 3. Keep windows closed at night.  Keep car windows closed while driving. 4. Minimize morning  activities outdoors, a time when pollen counts are usually at their highest. 5. Stay indoors as much as possible when pollen counts or humidity is high and on windy days when pollen tends to remain in the air longer. 6. Use air conditioning when possible.  Many air conditioners have filters that trap the pollen spores. 7. Use a HEPA room air filter to remove pollen form the indoor air you breathe.   Control of Dust Mite Allergen Dust mites play a major role in allergic asthma and rhinitis. They occur in environments with high humidity wherever human skin is found. Dust mites absorb humidity from the atmosphere (ie, they do not drink) and feed on organic matter (including shed human and animal skin). Dust mites are a microscopic type of insect that you cannot see with the naked eye. High levels of dust mites have been detected from mattresses, pillows, carpets, upholstered furniture, bed covers, clothes, soft toys and any woven material. The principal allergen of the dust mite is found in its feces. A gram of dust may contain 1,000 mites and 250,000 fecal particles. Mite antigen is easily measured in the air during house cleaning activities. Dust mites do not bite and do not cause harm to humans, other than by triggering allergies/asthma.  Ways to decrease your exposure to dust mites in your home:  1. Encase mattresses, box springs and pillows with a mite-impermeable barrier or cover  2. Wash sheets, blankets and drapes weekly in hot water (130 F) with  detergent and dry them in a dryer on the hot setting.  3. Have the room cleaned frequently with a vacuum cleaner and a damp dust-mop. For carpeting or rugs, vacuuming with a vacuum cleaner equipped with a high-efficiency particulate air (HEPA) filter. The dust mite allergic individual should not be in a room which is being cleaned and should wait 1 hour after cleaning before going into the room.  4. Do not sleep on upholstered furniture (eg,  couches).  5. If possible removing carpeting, upholstered furniture and drapery from the home is ideal. Horizontal blinds should be eliminated in the rooms where the person spends the most time (bedroom, study, television room). Washable vinyl, roller-type shades are optimal.  6. Remove all non-washable stuffed toys from the bedroom. Wash stuffed toys weekly like sheets and blankets above.  7. Reduce indoor humidity to less than 50%. Inexpensive humidity monitors can be purchased at most hardware stores. Do not use a humidifier as can make the problem worse and are not recommended.  Control of Cockroach Allergen  Cockroach allergen has been identified as an important cause of acute attacks of asthma, especially in urban settings.  There are fifty-five species of cockroach that exist in the [place], however only three, the [place], [place] species produce allergen that can affect patients with Asthma.  Allergens can be obtained from fecal particles, egg casings and secretions from cockroaches.    1. Remove food sources. 2. Reduce access to water. 3. Seal access and entry points. 4. Spray runways with 0.5-1% Diazinon or Chlorpyrifos 5. Blow boric acid power under stoves and refrigerator. 6. Place bait stations (hydramethylnon) at feeding sites.   Control of Dog or Cat Allergen Avoidance is the best way to manage a dog or cat allergy. If you have a dog or cat and are allergic to dog or cats, consider removing the dog or cat from the home. If you have a dog or cat but don't want to find it a new home, or if your family wants a pet even though someone in the household is allergic, here are some strategies that may help keep symptoms at bay:  8. Keep the pet out of your bedroom and restrict it to only a few rooms. Be advised that keeping the dog or cat in only one room will not limit the allergens to that room. 9. Don't pet, hug or kiss the dog or cat; if you do, wash your hands  with soap and water. 10. High-efficiency particulate air (HEPA) cleaners run continuously in a bedroom or living room can reduce allergen levels over time. 11. Regular use of a high-efficiency vacuum cleaner or a central vacuum can reduce allergen levels. 12. Giving your dog or cat a bath at least once a week can reduce airborne allergen.

## 2020-09-11 NOTE — Progress Notes (Signed)
RE: Scott Costa MRN: [id number] DOB: 08-12-1990 Date of Telemedicine Visit: 09/12/2020  Referring provider: Ardith Dark, MD Primary care provider: Ardith Dark, MD  Chief Complaint: Asthma   Telemedicine Follow Up Visit via Telephone: I connected with Scott Costa for a follow up on 09/12/20 by telephone and verified that I am speaking with the correct person using two identifiers.   I discussed the limitations, risks, security and privacy concerns of performing an evaluation and management service by telephone and the availability of in person appointments. I also discussed with the patient that there may be a patient responsible charge related to this service. The patient expressed understanding and agreed to proceed.  Patient is at home Provider is at the office.  Visit start time: 208 Visit end time: 78 Insurance consent/check in by: Fleet Contras Medical consent and medical assistant/nurse: Logan  History of Present Illness: He is a 30 y.o. male, who is being followed for asthma, allergic rhinitis, atopic dermatitis, and food allergy to shellfish. His previous allergy office visit was on 01/31/2020 with Thermon Leyland, FNP.  At today's visit, he reports his asthma has been well controlled with no shortness of breath or wheeze with activity or rest.  He does report occasional cough which is dry.  He denies pain or rattling with coughing.  He continues montelukast 10 mg once a day and has not needed his albuterol since his last visit to this clinic.  Allergic rhinitis is reported as well controlled with no medication at this time.  Atopic dermatitis is reported as well controlled with no current medical intervention.  He continues to avoid shellfish with no accidental ingestion or EpiPen use since his last visit to this clinic.  His current medications are listed in the chart.  Assessment and Plan: Scott Costa is a 30 y.o. male with: Patient Instructions  Asthma Continue montelukast  10 mg-take 1 tablet once a day to prevent coughing or wheezing Continue albuterol HFA-2 puffs every 4 hours if needed for coughing or wheezing.   You may use albuterol 2 puffs 5 to 15 minutes before exercise .  Follow up with chest CT as recommended by your primary care provider  Allergic rhinitis Continue allergen avoidance measures directed toward grass pollen, weed pollen, tree pollen, dust mite, cockroach, and cat as listed below Continue Zyrtec 10 mg-take 1 tablet once a day if needed for runny nose or itchy eyes Continue Flonase 2 sprays per nostril once a day if needed for stuffy nose.  In the right nostril, point the applicator out toward the right ear. In the left nostril, point the applicator out toward the left ear Consider saline nasal rinses as needed for nasal symptoms. Use this before any medicated nasal sprays for best result  Allergic conjunctivitis Continue Visine AC-1 drop 3 times a day if needed for itchy eyes. Some over the counter eye drops include Pataday one drop in each eye once a day as needed for red, itchy eyes OR Zaditor one drop in each eye twice a day as needed for red itchy eyes.  Atopic dermaitits Continue a daily moisturizing routine  Food allergy Continue to avoid shellfish. In case of an allergic reaction, take Benadryl 50 mg every 4 hours, and if life-threatening symptoms occur, inject with EpiPen 0.3 mg.  Call us if you are not doing well on this treatment plan  Follow up in 6 months or sooner if needed.  Reducing Pollen Exposure The American Academy of Allergy, Asthma and  Immunology suggests the following steps to reduce your exposure to pollen during allergy seasons. 1. Do not hang sheets or clothing out to dry; pollen may collect on these items. 2. Do not mow lawns or spend time around freshly cut grass; mowing stirs up pollen. 3. Keep windows closed at night.  Keep car windows closed while driving. 4. Minimize morning activities outdoors, a time  when pollen counts are usually at their highest. 5. Stay indoors as much as possible when pollen counts or humidity is high and on windy days when pollen tends to remain in the air longer. 6. Use air conditioning when possible.  Many air conditioners have filters that trap the pollen spores. 7. Use a HEPA room air filter to remove pollen form the indoor air you breathe.   Control of Dust Mite Allergen Dust mites play a major role in allergic asthma and rhinitis. They occur in environments with high humidity wherever human skin is found. Dust mites absorb humidity from the atmosphere (ie, they do not drink) and feed on organic matter (including shed human and animal skin). Dust mites are a microscopic type of insect that you cannot see with the naked eye. High levels of dust mites have been detected from mattresses, pillows, carpets, upholstered furniture, bed covers, clothes, soft toys and any woven material. The principal allergen of the dust mite is found in its feces. A gram of dust may contain 1,000 mites and 250,000 fecal particles. Mite antigen is easily measured in the air during house cleaning activities. Dust mites do not bite and do not cause harm to humans, other than by triggering allergies/asthma.  Ways to decrease your exposure to dust mites in your home:  1. Encase mattresses, box springs and pillows with a mite-impermeable barrier or cover  2. Wash sheets, blankets and drapes weekly in hot water (130 F) with detergent and dry them in a dryer on the hot setting.  3. Have the room cleaned frequently with a vacuum cleaner and a damp dust-mop. For carpeting or rugs, vacuuming with a vacuum cleaner equipped with a high-efficiency particulate air (HEPA) filter. The dust mite allergic individual should not be in a room which is being cleaned and should wait 1 hour after cleaning before going into the room.  4. Do not sleep on upholstered furniture (eg, couches).  5. If possible removing  carpeting, upholstered furniture and drapery from the home is ideal. Horizontal blinds should be eliminated in the rooms where the person spends the most time (bedroom, study, television room). Washable vinyl, roller-type shades are optimal.  6. Remove all non-washable stuffed toys from the bedroom. Wash stuffed toys weekly like sheets and blankets above.  7. Reduce indoor humidity to less than 50%. Inexpensive humidity monitors can be purchased at most hardware stores. Do not use a humidifier as can make the problem worse and are not recommended.  Control of Cockroach Allergen  Cockroach allergen has been identified as an important cause of acute attacks of asthma, especially in urban settings.  There are fifty-five species of cockroach that exist in the [place], however only three, the [place], [place] species produce allergen that can affect patients with Asthma.  Allergens can be obtained from fecal particles, egg casings and secretions from cockroaches.    1. Remove food sources. 2. Reduce access to water. 3. Seal access and entry points. 4. Spray runways with 0.5-1% Diazinon or Chlorpyrifos 5. Blow boric acid power under stoves and refrigerator. 6. Place bait  stations (hydramethylnon) at feeding sites.   Control of Dog or Cat Allergen Avoidance is the best way to manage a dog or cat allergy. If you have a dog or cat and are allergic to dog or cats, consider removing the dog or cat from the home. If you have a dog or cat but don't want to find it a new home, or if your family wants a pet even though someone in the household is allergic, here are some strategies that may help keep symptoms at bay:  8. Keep the pet out of your bedroom and restrict it to only a few rooms. Be advised that keeping the dog or cat in only one room will not limit the allergens to that room. 9. Don't pet, hug or kiss the dog or cat; if you do, wash your hands with soap and  water. 10. High-efficiency particulate air (HEPA) cleaners run continuously in a bedroom or living room can reduce allergen levels over time. 11. Regular use of a high-efficiency vacuum cleaner or a central vacuum can reduce allergen levels. 12. Giving your dog or cat a bath at least once a week can reduce airborne allergen.    Return in about 6 months (around 03/12/2021), or if symptoms worsen or fail to improve.  Meds ordered this encounter  Medications  . EPINEPHrine 0.3 mg/0.3 mL IJ SOAJ injection    Sig: Use as directed for severe allergic reactions    Dispense:  2 each    Refill:  1  . montelukast (SINGULAIR) 10 MG tablet    Sig: Take 1 tablet (10 mg total) by mouth at bedtime.    Dispense:  30 tablet    Refill:  0    Medication List:  Current Outpatient Medications  Medication Sig Dispense Refill  . Ascorbic Acid (VITAMIN C) 1000 MG tablet Take 1,000 mg by mouth daily.    . diazepam (VALIUM) 5 MG tablet Take 1.5 tablets (7.5 mg total) by mouth every 12 (twelve) hours as needed for anxiety. 90 tablet 1  . L-Methylfolate-Algae-B12-B6 (METANX) 3-90.314-2-35 MG CAPS Take 1 capsule daily. 30 capsule 5  . EPINEPHrine 0.3 mg/0.3 mL IJ SOAJ injection Use as directed for severe allergic reactions 2 each 1  . fluticasone (FLONASE) 50 MCG/ACT nasal spray Place 2 sprays into both nostrils daily as needed (for stuffy nose). (Patient not taking: Reported on 09/12/2020) 16 g 5  . montelukast (SINGULAIR) 10 MG tablet Take 1 tablet (10 mg total) by mouth at bedtime. 30 tablet 0  . triamcinolone cream (KENALOG) 0.1 % Use twice a day if needed to red itchy areas below the face (Patient not taking: Reported on 09/12/2020) 45 g 3   No current facility-administered medications for this visit.   Allergies: Allergies  Allergen Reactions  . Shellfish Allergy Anaphylaxis  . Nickel    I reviewed his past medical history, social history, family history, and environmental history and no significant  changes have been reported from previous visit on 01/31/2020.  Objective: Physical Exam Not obtained as encounter was done via telephone.   Previous notes and tests were reviewed.  I discussed the assessment and treatment plan with the patient. The patient was provided an opportunity to ask questions and all were answered. The patient agreed with the plan and demonstrated an understanding of the instructions.   The patient was advised to call back or seek an in-person evaluation if the symptoms worsen or if the condition fails to improve as anticipated.  I  provided 22 minutes of non-face-to-face time during this encounter.  It was my pleasure to participate in Scott Costa care today. Please feel free to contact me with any questions or concerns.   Sincerely,  [name], FNP

## 2020-09-12 ENCOUNTER — Other Ambulatory Visit: Payer: Self-pay | Admitting: Family Medicine

## 2020-09-12 ENCOUNTER — Encounter: Payer: Self-pay | Admitting: Family Medicine

## 2020-09-12 ENCOUNTER — Other Ambulatory Visit: Payer: Self-pay

## 2020-09-12 ENCOUNTER — Ambulatory Visit (INDEPENDENT_AMBULATORY_CARE_PROVIDER_SITE_OTHER): Payer: No Typology Code available for payment source | Admitting: Family Medicine

## 2020-09-12 DIAGNOSIS — J4520 Mild intermittent asthma, uncomplicated: Secondary | ICD-10-CM | POA: Diagnosis not present

## 2020-09-12 DIAGNOSIS — J301 Allergic rhinitis due to pollen: Secondary | ICD-10-CM | POA: Diagnosis not present

## 2020-09-12 DIAGNOSIS — L2084 Intrinsic (allergic) eczema: Secondary | ICD-10-CM

## 2020-09-12 DIAGNOSIS — T7800XD Anaphylactic reaction due to unspecified food, subsequent encounter: Secondary | ICD-10-CM | POA: Diagnosis not present

## 2020-09-12 MED ORDER — MONTELUKAST SODIUM 10 MG PO TABS: 10.0000 mg | ORAL_TABLET | Freq: Every day | ORAL | 0 refills | Status: DC

## 2020-09-12 MED ORDER — EPINEPHRINE 0.3 MG/0.3ML IJ SOAJ: INTRAMUSCULAR | 1 refills | Status: DC

## 2020-09-12 MED FILL — MONTELUKAST SOD 10 MG TAB: 10 | 30 days supply | Qty: 30 | Fill #0

## 2020-09-12 MED FILL — EPINEPHRINE 0.3 MG AUTO-INJ: 0.3 | 30 days supply | Qty: 2 | Fill #0

## 2020-09-18 ENCOUNTER — Encounter: Payer: Self-pay | Admitting: Family Medicine

## 2020-09-21 ENCOUNTER — Other Ambulatory Visit: Payer: Self-pay | Admitting: Family Medicine

## 2020-09-21 MED ORDER — DIAZEPAM 5 MG PO TABS: 7.5000 mg | ORAL_TABLET | Freq: Two times a day (BID) | ORAL | 1 refills | Status: DC | PRN

## 2020-09-21 MED FILL — diazePAM 5 MG TABS: 5 | 30 days supply | Qty: 90 | Fill #0

## 2020-09-26 ENCOUNTER — Ambulatory Visit (INDEPENDENT_AMBULATORY_CARE_PROVIDER_SITE_OTHER): Payer: No Typology Code available for payment source | Admitting: Neurology

## 2020-09-26 ENCOUNTER — Other Ambulatory Visit: Payer: Self-pay

## 2020-09-26 DIAGNOSIS — R202 Paresthesia of skin: Secondary | ICD-10-CM

## 2020-09-26 NOTE — Procedures (Signed)
[facility]  [place], [place]  [place], [place] [address] Tel: [phone] Fax:  [phone] Test Date:  09/26/2020  Patient: Scott Costa DOB: [birth year] Physician: Narda Amber, DO  Sex: Male Height: 6' " Ref Phys: Narda Amber, DO  ID#: [id number]   Technician:    Patient Complaints: This is a 30 year old man referred for evaluation of generalized paresthesias of the arms and legs.  NCV & EMG Findings: Electrodiagnostic testing of the right lower extremity and additional studies of the left shows: 1. Bilateral sural and superficial peroneal sensory responses are within normal limits. 2. Bilateral peroneal and tibial motor responses are within normal limits. 3. Bilateral tibial H reflex studies are within normal limits. 4. There is no evidence of active or chronic motor axonal changes affecting any of the tested muscles.  Motor unit configuration and recruitment pattern is within normal limits.  Impression: This is a normal study of the lower extremities.  In particular, there is no evidence of a sensorimotor polyneuropathy or lumbosacral radiculopathy.   ___________________________ Narda Amber, DO    Nerve Conduction Studies Anti Sensory Summary Table   Stim Site NR Peak (ms) Norm Peak (ms) P-T Amp (V) Norm P-T Amp  Left Sup Peroneal Anti Sensory (Ant Lat Mall)  32C  12 cm    3.0 <4.5 16.2 >5  Right Sup Peroneal Anti Sensory (Ant Lat Mall)  32C  12 cm    2.6 <4.5 19.8 >5  Left Sural Anti Sensory (Lat Mall)  32C  Calf    3.5 <4.5 13.1 >5  Right Sural Anti Sensory (Lat Mall)  32C  Calf    3.4 <4.5 15.3 >5   Motor Summary Table   Stim Site NR Onset (ms) Norm Onset (ms) O-P Amp (mV) Norm O-P Amp Site1 Site2 Delta-0 (ms) Dist (cm) Vel (m/s) Norm Vel (m/s)  Left Peroneal Motor (Ext Dig Brev)  32C  Ankle    3.8 <5.5 6.1 >3 B Fib Ankle 8.5 40.0 47 >40  B Fib    12.3  5.8  Poplt B Fib 1.9 10.0 53 >40  Poplt    14.2  5.6         Right Peroneal  Motor (Ext Dig Brev)  32C  Ankle    3.6 <5.5 5.9 >3 B Fib Ankle 8.5 43.0 51 >40  B Fib    12.1  5.4  Poplt B Fib 1.7 10.0 59 >40  Poplt    13.8  5.3         Left Tibial Motor (Abd Hall Brev)  32C  Ankle    4.1 <6.0 14.9 >8 Knee Ankle 9.7 44.0 45 >40  Knee    13.8  14.7         Right Tibial Motor (Abd Hall Brev)  32C  Ankle    3.8 <6.0 13.3 >8 Knee Ankle 9.6 45.0 47 >40  Knee    13.4  12.3          H Reflex Studies   NR H-Lat (ms) Lat Norm (ms) L-R H-Lat (ms)  Left Tibial (Gastroc)  32C     33.88 <35 2.31  Right Tibial (Gastroc)  32C     31.56 <35 2.31   EMG   Side Muscle Ins Act Fibs Psw Fasc Number Recrt Dur Dur. Amp Amp. Poly Poly. Comment  Right AntTibialis Nml Nml Nml Nml Nml Nml Nml Nml Nml Nml Nml Nml N/A  Right Gastroc Nml Nml Nml Nml Nml  Nml Nml Nml Nml Nml Nml Nml N/A  Right Flex Dig Long Nml Nml Nml Nml Nml Nml Nml Nml Nml Nml Nml Nml N/A  Right RectFemoris Nml Nml Nml Nml Nml Nml Nml Nml Nml Nml Nml Nml N/A  Right GluteusMed Nml Nml Nml Nml Nml Nml Nml Nml Nml Nml Nml Nml N/A  Left AntTibialis Nml Nml Nml Nml Nml Nml Nml Nml Nml Nml Nml Nml N/A  Left Gastroc Nml Nml Nml Nml Nml Nml Nml Nml Nml Nml Nml Nml N/A  Left Flex Dig Long Nml Nml Nml Nml Nml Nml Nml Nml Nml Nml Nml Nml N/A  Left RectFemoris Nml Nml Nml Nml Nml Nml Nml Nml Nml Nml Nml Nml N/A  Left GluteusMed Nml Nml Nml Nml Nml Nml Nml Nml Nml Nml Nml Nml N/A      Waveforms:

## 2020-10-24 ENCOUNTER — Ambulatory Visit (INDEPENDENT_AMBULATORY_CARE_PROVIDER_SITE_OTHER): Payer: No Typology Code available for payment source | Admitting: Neurology

## 2020-10-24 ENCOUNTER — Other Ambulatory Visit: Payer: Self-pay

## 2020-10-24 DIAGNOSIS — R202 Paresthesia of skin: Secondary | ICD-10-CM

## 2020-10-24 NOTE — Procedures (Signed)
[facility]  [place], [place]  [place], [place] [address] Tel: [phone] Fax:  [phone] Test Date:  10/24/2020  Patient: Scott Costa DOB: July 03, 1991 Physician: Narda Amber, DO  Sex: Male Height: 6' " Ref Phys: Narda Amber, DO  ID#: [id number]   Technician:    Patient Complaints: This is a 30 year old man referred for evaluation of bilateral arm paresthesias.  NCV & EMG Findings: Extensive electrodiagnostic testing of the right upper extremity and additional studies of the left shows: 1. Bilateral median, ulnar, and mixed palmar sensory responses are within normal limits. 2. Bilateral median and ulnar motor responses are within normal limits. 3. There is no evidence of active or chronic motor axonal loss changes affecting any of the tested muscles.  Motor unit configuration and recruitment pattern is within normal limits.   Impression: This is a normal study of the upper extremities.  In particular, there is no evidence of carpal tunnel syndrome, cervical radiculopathy, or sensorimotor polyneuropathy.   ___________________________ Narda Amber, DO    Nerve Conduction Studies Anti Sensory Summary Table   Stim Site NR Peak (ms) Norm Peak (ms) P-T Amp (V) Norm P-T Amp  Left Median Anti Sensory (2nd Digit)  32C  Wrist    3.2 <3.4 45.4 >20  Right Median Anti Sensory (2nd Digit)  32C  Wrist    3.1 <3.4 43.9 >20  Left Ulnar Anti Sensory (5th Digit)  32C  Wrist    3.1 <3.1 36.2 >12  Right Ulnar Anti Sensory (5th Digit)  32C  Wrist    3.0 <3.1 31.9 >12   Motor Summary Table   Stim Site NR Onset (ms) Norm Onset (ms) O-P Amp (mV) Norm O-P Amp Site1 Site2 Delta-0 (ms) Dist (cm) Vel (m/s) Norm Vel (m/s)  Left Median Motor (Abd Poll Brev)  32C  Wrist    3.3 <3.9 12.0 >6 Elbow Wrist 5.8 32.0 55 >50  Elbow    9.1  11.4         Right Median Motor (Abd Poll Brev)  32C  Wrist    3.0 <3.9 14.2 >6 Elbow Wrist 5.8 31.0 53 >50  Elbow    8.8  14.2          Left Ulnar Motor (Abd Dig Minimi)  32C  Wrist    3.0 <3.1 12.6 >7 B Elbow Wrist 4.3 25.0 58 >50  B Elbow    7.3  12.4  A Elbow B Elbow 1.8 10.0 56 >50  A Elbow    9.1  12.3         Right Ulnar Motor (Abd Dig Minimi)  32C  Wrist    3.0 <3.1 12.9 >7 B Elbow Wrist 4.7 26.0 55 >50  B Elbow    7.7  11.7  A Elbow B Elbow 1.8 10.0 56 >50  A Elbow    9.5  10.5          Comparison Summary Table   Stim Site NR Peak (ms) Norm Peak (ms) P-T Amp (V) Site1 Site2 Delta-P (ms) Norm Delta (ms)  Left Median/Ulnar Palm Comparison (Wrist - 8cm)  32C  Median Palm    1.8 <2.2 66.8 Median Palm Ulnar Palm 0.2   Ulnar Palm    2.0 <2.2 25.0      Right Median/Ulnar Palm Comparison (Wrist - 8cm)  32C  Median Palm    1.8 <2.2 59.9 Median Palm Ulnar Palm 0.0   Ulnar Palm    1.8 <2.2 23.2  EMG   Side Muscle Ins Act Fibs Psw Fasc Number Recrt Dur Dur. Amp Amp. Poly Poly. Comment  Right 1stDorInt Nml Nml Nml Nml Nml Nml Nml Nml Nml Nml Nml Nml N/A  Right PronatorTeres Nml Nml Nml Nml Nml Nml Nml Nml Nml Nml Nml Nml N/A  Right Biceps Nml Nml Nml Nml Nml Nml Nml Nml Nml Nml Nml Nml N/A  Right Triceps Nml Nml Nml Nml Nml Nml Nml Nml Nml Nml Nml Nml N/A  Right Deltoid Nml Nml Nml Nml Nml Nml Nml Nml Nml Nml Nml Nml N/A  Left 1stDorInt Nml Nml Nml Nml Nml Nml Nml Nml Nml Nml Nml Nml N/A  Left PronatorTeres Nml Nml Nml Nml Nml Nml Nml Nml Nml Nml Nml Nml N/A  Left Biceps Nml Nml Nml Nml Nml Nml Nml Nml Nml Nml Nml Nml N/A  Left Triceps Nml Nml Nml Nml Nml Nml Nml Nml Nml Nml Nml Nml N/A  Left Deltoid Nml Nml Nml Nml Nml Nml Nml Nml Nml Nml Nml Nml N/A      Waveforms:

## 2020-10-31 ENCOUNTER — Encounter: Payer: Self-pay | Admitting: Family Medicine

## 2020-10-31 ENCOUNTER — Ambulatory Visit (INDEPENDENT_AMBULATORY_CARE_PROVIDER_SITE_OTHER): Payer: No Typology Code available for payment source | Admitting: Family Medicine

## 2020-10-31 ENCOUNTER — Other Ambulatory Visit: Payer: Self-pay

## 2020-10-31 VITALS — BP 113/71 | HR 78 | Temp 98.3°F | Ht 72.0 in | Wt 166.2 lb

## 2020-10-31 DIAGNOSIS — R109 Unspecified abdominal pain: Secondary | ICD-10-CM | POA: Diagnosis not present

## 2020-10-31 DIAGNOSIS — R10A0 Flank pain, unspecified side: Secondary | ICD-10-CM

## 2020-10-31 LAB — POCT URINALYSIS DIPSTICK
Bilirubin, UA: NEGATIVE
Blood, UA: NEGATIVE
Glucose, UA: NEGATIVE
Ketones, UA: NEGATIVE
Leukocytes, UA: NEGATIVE
Nitrite, UA: NEGATIVE
Protein, UA: NEGATIVE
Spec Grav, UA: <=1.005 — AB (ref 1.010–1.025)
Urobilinogen, UA: 0.2 E.U./dL
pH, UA: 6.5 (ref 5.0–8.0)

## 2020-10-31 LAB — URINALYSIS, ROUTINE W REFLEX MICROSCOPIC
Bilirubin Urine: NEGATIVE
Hgb urine dipstick: NEGATIVE
Ketones, ur: NEGATIVE
Leukocytes,Ua: NEGATIVE
Nitrite: NEGATIVE
RBC / HPF: NONE SEEN (ref 0–?)
Specific Gravity, Urine: <=1.005 — AB (ref 1.000–1.030)
Total Protein, Urine: NEGATIVE
Urine Glucose: NEGATIVE
Urobilinogen, UA: 0.2 (ref 0.0–1.0)
WBC, UA: NONE SEEN (ref 0–?)
pH: 6.5 (ref 5.0–8.0)

## 2020-10-31 NOTE — Progress Notes (Signed)
   Scott Costa is a 30 y.o. male who presents today for an office visit.  Assessment/Plan:  Right Flank Pain Given family and possible personal history of kidney stones will check urinalysis with microscopy to rule out.  Also check urine culture.  His exam is otherwise reassuring today.  Constipation may be playing a role.  Work-up is negative for kidney stone will consider bowel cleanout.  May need imaging if symptoms persist.  Discussed reasons to return to care.  He can continue using OTC meds as needed.     Subjective:  HPI:  Patient with right flank pain.  Started about 3 weeks ago.  Intermittent in nature.  Sometimes will radiate into his abdomen.  Has a family history of kidney stones.  Thinks that he may have passed a kidney stone several years ago but has never had documented kidney stones in the past.  No nausea or vomiting.  Has had some constipation.  No diarrhea.       Objective:  Physical Exam: BP 113/71   Pulse 78   Temp 98.3 F (36.8 C) (Temporal)   Ht 6' (1.829 m)   Wt 166 lb 3.2 oz (75.4 kg)   SpO2 100%   BMI 22.54 kg/m   Gen: No acute distress, resting comfortably CV: Regular rate and rhythm with no murmurs appreciated Pulm: Normal work of breathing, clear to auscultation bilaterally with no crackles, wheezes, or rhonchi MSK: Back without deformities.  No CVA tenderness GI: S, NT, ND Neuro: Grossly normal, moves all extremities Psych: Normal affect and thought content      Scott Costa M. [name], MD 10/31/2020 11:14 AM

## 2020-10-31 NOTE — Patient Instructions (Signed)
It was very nice to see you today!  We will check a urine sample.  Make sure you do not have a kidney stone.  It is possible constipation could be playing a role as well.  If it does not look like you have a kidney stone we may try a bowel cleanout.  Please let me know if your symptoms worsen or not improve.  Take care, Dr [name]  PLEASE NOTE:  If you had any lab tests please let us know if you have not heard back within a few days. You may see your results on mychart before we have a chance to review them but we will give you a call once they are reviewed by [name]. If we ordered any referrals today, please let us know if you have not heard from their office within the next week.   Please try these tips to maintain a healthy lifestyle:   Eat at least 3 REAL meals and 1-2 snacks per day.  Aim for no more than 5 hours between eating.  If you eat breakfast, please do so within one hour of getting up.    Each meal should contain half fruits/vegetables, one quarter protein, and one quarter carbs (no bigger than a computer mouse)   Cut down on sweet beverages. This includes juice, soda, and sweet tea.     Drink at least 1 glass of water with each meal and aim for at least 8 glasses per day   Exercise at least 150 minutes every week.

## 2020-11-01 LAB — URINE CULTURE
MICRO NUMBER:: 11705270
Result:: NO GROWTH
SPECIMEN QUALITY:: ADEQUATE

## 2020-11-02 NOTE — Progress Notes (Signed)
Please inform patient of the following:  Urine studies with no signs of infection or kidney stones. If pain persists we could consider bowel cleanout as we discussed at his office visit. Would like for him to let me know if symptoms are not improving.  Scott Costa. [name], MD 11/02/2020 1:03 PM

## 2020-11-03 NOTE — Progress Notes (Signed)
His low specific gravity means that his urine is dilute. This by itself is not a cause for concern - its juts a sign that he is well hydrated.   Scott Costa. [name], MD 11/03/2020 12:08 PM

## 2020-11-08 ENCOUNTER — Encounter: Payer: Self-pay | Admitting: Family Medicine

## 2020-11-09 ENCOUNTER — Other Ambulatory Visit (HOSPITAL_COMMUNITY): Payer: Self-pay

## 2020-11-09 ENCOUNTER — Other Ambulatory Visit: Payer: Self-pay | Admitting: *Deleted

## 2020-11-09 MED ORDER — MONTELUKAST SODIUM 10 MG PO TABS
10.0000 mg | ORAL_TABLET | Freq: Every day | ORAL | 5 refills | Status: DC
  Filled 2020-11-09: qty 30, 30d supply, fill #0
  Filled 2020-12-11: qty 30, 30d supply, fill #1
  Filled 2021-01-11: qty 30, 30d supply, fill #2
  Filled 2021-02-14: qty 30, 30d supply, fill #3
  Filled 2021-03-14: qty 30, 30d supply, fill #4
  Filled 2021-04-18: qty 30, 30d supply, fill #5

## 2020-11-28 ENCOUNTER — Other Ambulatory Visit (HOSPITAL_COMMUNITY): Payer: Self-pay

## 2020-11-28 ENCOUNTER — Encounter: Payer: Self-pay | Admitting: Family Medicine

## 2020-11-28 MED FILL — L-Methylfolate-Algae-Vit B12-B6 Cap 3-90.314-2-35 MG: ORAL | 30 days supply | Qty: 30 | Fill #0 | Status: AC

## 2020-11-29 ENCOUNTER — Other Ambulatory Visit (HOSPITAL_COMMUNITY): Payer: Self-pay

## 2020-11-29 NOTE — Telephone Encounter (Signed)
Can you please have this patient continue montelukast, stop antihistamine for about 1 week, begin Mucinex [phone] mg twice a day, begin nasal saline rinses, and begin Flonase nasal spray. Please have him call if he experiences worsening symptoms or if his symptoms do not improve. Thank you

## 2020-11-30 ENCOUNTER — Other Ambulatory Visit (HOSPITAL_COMMUNITY): Payer: Self-pay

## 2020-12-04 ENCOUNTER — Ambulatory Visit: Payer: No Typology Code available for payment source | Admitting: Neurology

## 2020-12-11 ENCOUNTER — Other Ambulatory Visit (HOSPITAL_COMMUNITY): Payer: Self-pay

## 2020-12-11 MED ORDER — FLUTICASONE PROPIONATE 50 MCG/ACT NA SUSP
NASAL | 4 refills | Status: DC
  Filled 2020-12-11: qty 16, 30d supply, fill #0

## 2020-12-17 ENCOUNTER — Encounter: Payer: Self-pay | Admitting: Family Medicine

## 2020-12-25 ENCOUNTER — Telehealth (INDEPENDENT_AMBULATORY_CARE_PROVIDER_SITE_OTHER): Payer: No Typology Code available for payment source | Admitting: Family Medicine

## 2020-12-25 ENCOUNTER — Encounter: Payer: Self-pay | Admitting: Family Medicine

## 2020-12-25 ENCOUNTER — Other Ambulatory Visit (HOSPITAL_COMMUNITY): Payer: Self-pay

## 2020-12-25 VITALS — Ht 72.0 in | Wt 165.0 lb

## 2020-12-25 DIAGNOSIS — F419 Anxiety disorder, unspecified: Secondary | ICD-10-CM | POA: Diagnosis not present

## 2020-12-25 DIAGNOSIS — G4730 Sleep apnea, unspecified: Secondary | ICD-10-CM | POA: Diagnosis not present

## 2020-12-25 DIAGNOSIS — R2990 Unspecified symptoms and signs involving the nervous system: Secondary | ICD-10-CM | POA: Diagnosis not present

## 2020-12-25 DIAGNOSIS — G4700 Insomnia, unspecified: Secondary | ICD-10-CM

## 2020-12-25 MED ORDER — BELSOMRA 10 MG PO TABS
10.0000 mg | ORAL_TABLET | Freq: Every evening | ORAL | 5 refills | Status: DC | PRN
  Filled 2020-12-25: qty 30, 30d supply, fill #0

## 2020-12-25 NOTE — Progress Notes (Signed)
   Scott Costa is a 30 y.o. male who presents today for a virtual office visit.  Assessment/Plan:  Chronic Problems Addressed Today: Insomnia Uncontrolled.  Has sleep maintenance insomnia.  Is able to fall asleep normally.  He is also concerned about possible sleep apnea and has witnessed apneic episodes.  Will place referral for sleep study.  Discussed potential treatment options.  He is practicing good sleep hygiene.  We will start Belsomra to help with sleep maintenance insomnia.  He will follow-up with me in a few weeks.  Multiple neurological symptoms Has had extensive neurologic work-up was essentially negative.  Symptoms have been improving though seem to have plateaued recently.  We will continue with watchful waiting.  Anxiety He stopped taking the Valium.  Overall anxiety seems to be decently well controlled off meds.     Subjective:  HPI:  See A/P.       Objective/Observations  Physical Exam: Gen: NAD, resting comfortably Pulm: Normal work of breathing Neuro: Grossly normal, moves all extremities Psych: Normal affect and thought content  Virtual Visit via Video   I connected with Scott Costa on 12/25/20 at 10:40 AM EDT by a video enabled telemedicine application and verified that I am speaking with the correct person using two identifiers. The limitations of evaluation and management by telemedicine and the availability of in person appointments were discussed. The patient expressed understanding and agreed to proceed.   Patient location: Home Provider location: [place] participating in the virtual visit: Myself and Patient     Algis Greenhouse. [name], MD 12/25/2020 10:55 AM

## 2020-12-25 NOTE — Assessment & Plan Note (Signed)
Has had extensive neurologic work-up was essentially negative.  Symptoms have been improving though seem to have plateaued recently.  We will continue with watchful waiting.

## 2020-12-25 NOTE — Assessment & Plan Note (Signed)
Uncontrolled.  Has sleep maintenance insomnia.  Is able to fall asleep normally.  He is also concerned about possible sleep apnea and has witnessed apneic episodes.  Will place referral for sleep study.  Discussed potential treatment options.  He is practicing good sleep hygiene.  We will start Belsomra to help with sleep maintenance insomnia.  He will follow-up with me in a few weeks.

## 2020-12-25 NOTE — Assessment & Plan Note (Signed)
He stopped taking the Valium.  Overall anxiety seems to be decently well controlled off meds.

## 2020-12-26 ENCOUNTER — Other Ambulatory Visit (HOSPITAL_COMMUNITY): Payer: Self-pay

## 2020-12-30 ENCOUNTER — Encounter: Payer: Self-pay | Admitting: Family Medicine

## 2021-01-03 ENCOUNTER — Other Ambulatory Visit (HOSPITAL_COMMUNITY): Payer: Self-pay

## 2021-01-11 ENCOUNTER — Other Ambulatory Visit (HOSPITAL_COMMUNITY): Payer: Self-pay

## 2021-01-17 ENCOUNTER — Encounter: Payer: Self-pay | Admitting: Family Medicine

## 2021-01-17 NOTE — Telephone Encounter (Signed)
Please have him continue montelukast daily, saline nasal rinses followed by Flonase 2 sprays in each nostril. Please have him add azelastine nasal spray 2 sprays in each nostril 1-2 times a day. This should help with sinus headache. Please have him call back if no relief of symptoms. Thank you

## 2021-01-18 ENCOUNTER — Other Ambulatory Visit: Payer: Self-pay

## 2021-01-18 ENCOUNTER — Other Ambulatory Visit (HOSPITAL_COMMUNITY): Payer: Self-pay

## 2021-01-18 MED ORDER — AZELASTINE HCL 0.1 % NA SOLN
NASAL | 12 refills | Status: DC
  Filled 2021-01-18: qty 30, 25d supply, fill #0

## 2021-01-19 ENCOUNTER — Other Ambulatory Visit (HOSPITAL_COMMUNITY): Payer: Self-pay

## 2021-01-23 NOTE — Telephone Encounter (Signed)
Can you please make an appointment for this patient to be seen in the clinic or via tele? Thank you

## 2021-01-30 ENCOUNTER — Encounter: Payer: Self-pay | Admitting: Family Medicine

## 2021-01-30 ENCOUNTER — Other Ambulatory Visit (HOSPITAL_COMMUNITY): Payer: Self-pay

## 2021-01-30 ENCOUNTER — Ambulatory Visit (INDEPENDENT_AMBULATORY_CARE_PROVIDER_SITE_OTHER): Payer: No Typology Code available for payment source | Admitting: Family Medicine

## 2021-01-30 ENCOUNTER — Other Ambulatory Visit: Payer: Self-pay

## 2021-01-30 DIAGNOSIS — J301 Allergic rhinitis due to pollen: Secondary | ICD-10-CM | POA: Diagnosis not present

## 2021-01-30 DIAGNOSIS — L2084 Intrinsic (allergic) eczema: Secondary | ICD-10-CM

## 2021-01-30 DIAGNOSIS — T7800XD Anaphylactic reaction due to unspecified food, subsequent encounter: Secondary | ICD-10-CM

## 2021-01-30 DIAGNOSIS — H1013 Acute atopic conjunctivitis, bilateral: Secondary | ICD-10-CM

## 2021-01-30 DIAGNOSIS — J4520 Mild intermittent asthma, uncomplicated: Secondary | ICD-10-CM | POA: Diagnosis not present

## 2021-01-30 DIAGNOSIS — H1010 Acute atopic conjunctivitis, unspecified eye: Secondary | ICD-10-CM

## 2021-01-30 MED ORDER — XHANCE 93 MCG/ACT NA EXHU: 2.0000 | INHALANT_SUSPENSION | Freq: Two times a day (BID) | NASAL | 5 refills | Status: AC

## 2021-01-30 MED ORDER — PREDNISONE 10 MG PO TABS
ORAL_TABLET | ORAL | 0 refills | Status: DC
  Filled 2021-01-30: qty 15, 5d supply, fill #0

## 2021-01-30 NOTE — Progress Notes (Signed)
RE: Scott Costa MRN: [id number] DOB: 09-Feb-1991 Date of Telemedicine Visit: 01/30/2021  Referring provider: [name], MD Primary care provider: [name], MD  Chief Complaint: Nasal Congestion (Sinus issues for 2 months, pain between eyes and upper ridge of nose, post nasal drip)   Telemedicine Follow Up Visit via Telephone: I connected with [name] for a follow up on 01/30/21 by telephone and verified that I am speaking with the correct person using two identifiers.   I discussed the limitations, risks, security and privacy concerns of performing an evaluation and management service by telephone and the availability of in person appointments. I also discussed with the patient that there may be a patient responsible charge related to this service. The patient expressed understanding and agreed to proceed.  Patient is at home  Provider is at the office.  Visit start time: 48 Visit end time: Scott Costa consent/check in by: [facility] Medical consent and medical assistant/nurse: Morey Hummingbird  History of Present Illness: He is a 30 y.o. male, who is being followed for asthma, allergic rhinitis, allergic conjunctivitis, atopic dermatitis, and food allergy to shellfish. His previous allergy office visit was on 09/12/2020 with [name], [name]. At today's visit, he reports that about 2 months ago, after hiking, he began to experience cough, irritation in his throat, and pressure in his sinuses between his eyes. He reports this pressure is beginning to wane, however, some frequent discomfort continues. He also reports a feeling of "brain fog" that has persisted. Other symptoms include nasal congestion occurring mostly in the morning, minimal post nasal drainage, and throat swelling when he is outside for long periods of time. He denies fever, nasal drainage and sneezing. He has stopped saline nasal rinses, Flonase, and azelastine about 5 days ago without any change in his symptoms.  His last skin testing was in 2021 and was positive to grass pollen, weed pollen, tree pollen, dust mites, cockroach, and cat. Allergic conjunctivitis is well controlled with no medical intervention. Asthma is reported as well controlled with no shortness of breath, cough or wheeze with activity or rest. He continues montelukast 10 mg once a day and has not needed to use albuterol since his last visit to this clinic. Atopic dermatitis is well controlled with no medical intervention. He continues to avoid shellfish with no accidental ingestion or EpiPen use since his last visit to this clinic.    Assessment and Plan: [name] is a 30 y.o. male with: Patient Instructions  Asthma Continue montelukast 10 mg-take 1 tablet once a day to prevent coughing or wheezing Continue albuterol HFA-2 puffs every 4 hours if needed for coughing or wheezing.   You may use albuterol 2 puffs 5 to 15 minutes before exercise .   Allergic rhinitis Begin prednisone 10 mg tablets. Take 2 tablets twice a day for 3 days, then take 2 tablets once a day for 1 day, then take 1 tablet on the 5th day, then stop  Begin Xhance 2 sprays in each nostril twice a day for nasal symptoms. This will replace Flonase Begin Mucinex (guaifenesin) [phone] mg twice a day and increase fluid intake as tolerated in order to this out mucus.  Continue an antihistamine once a day only if needed for a runny nose or itch. Remember to rotate to a different antihistamine about every 3 months. Some examples of over the counter antihistamines include Zyrtec (cetirizine), Xyzal (levocetirizine), Allegra (fexofenadine), and Claritin (loratidine).  Continue saline nasal rinses as needed for nasal  symptoms. Use this before any medicated nasal sprays for best result Continue allergen avoidance measures directed toward grass pollen, weed pollen, tree pollen, dust mite, cockroach, and cat as listed below If your symptoms are not well controlled with the treatment  plan above, consider allergen immunotherapy as means of long term control. - Allergy injections "re-train" and "reset" the immune system to ignore environmental allergens and decrease the resulting immune response to those allergens (sneezing, itchy watery eyes, runny nose, nasal congestion, etc).    - Allergy injections improve symptoms in 75-85% of patients.   - We can discuss this more at the next appointment if the medications are not working for you.  - Written information has been provided  Allergic conjunctivitis Some over the counter eye drops include Pataday one drop in each eye once a day as needed for red, itchy eyes OR Zaditor one drop in each eye twice a day as needed for red itchy eyes.  Atopic dermaitits Continue a daily moisturizing routine  Food allergy Continue to avoid shellfish. In case of an allergic reaction, take Benadryl 50 mg every 4 hours, and if life-threatening symptoms occur, inject with EpiPen 0.3 mg.  Call us if you are not doing well on this treatment plan  Follow up in 2 months or sooner if needed.   Return in about 2 months (around 04/01/2021), or if symptoms worsen or fail to improve.  Meds ordered this encounter  Medications   Fluticasone Propionate (XHANCE) 93 MCG/ACT EXHU    Sig: Place 2 puffs into the nose 2 (two) times daily.    Dispense:  32 mL    Refill:  5   predniSONE (DELTASONE) 10 MG tablet    Sig: Take 2 tablets twice a day for 3 days, then 2 tablets on day 4 and then 1 tablet on day 5 then stop    Dispense:  15 tablet    Refill:  0    Medication List:  Current Outpatient Medications  Medication Sig Dispense Refill   Ascorbic Acid (VITAMIN C) 1000 MG tablet Take 1,000 mg by mouth daily.     EPINEPHrine 0.3 mg/0.3 mL IJ SOAJ injection INJECT AS DIRECTED FOR SEVERE ALLERGIC REACTIONS 2 each 1   Fluticasone Propionate (XHANCE) 93 MCG/ACT EXHU Place 2 puffs into the nose 2 (two) times daily. 32 mL 5   Magnesium 125 MG CAPS Take 120 mg  by mouth 2 (two) times daily.     montelukast (SINGULAIR) 10 MG tablet Take 1 tablet (10 mg total) by mouth at bedtime. 30 tablet 5   predniSONE (DELTASONE) 10 MG tablet Take 2 tablets twice a day for 3 days, then 2 tablets on day 4 and then 1 tablet on day 5 then stop 15 tablet 0   azelastine (ASTELIN) 0.1 % nasal spray Instill 2 sprays in each nostril 1-2 times a day. (Patient not taking: Reported on 01/30/2021) 30 mL 12   fluticasone (FLONASE) 50 MCG/ACT nasal spray Place 2 sprays into both nostrils daily as needed (for stuffy nose). (Patient not taking: Reported on 01/30/2021) 16 g 5   triamcinolone cream (KENALOG) 0.1 % Use twice a day if needed to red itchy areas below the face (Patient not taking: Reported on 01/30/2021) 45 g 3   No current facility-administered medications for this visit.   Allergies: Allergies  Allergen Reactions   Shellfish Allergy Anaphylaxis   Covid-19 Mrna Vaccine [employer]) [Covid-19 Mrna Vacc (Moderna)]    Nickel    I reviewed  his past medical history, social history, family history, and environmental history and no significant changes have been reported from previous visit on 09/12/2020.  Review of Systems  HENT:  Positive for congestion, sinus pressure and sneezing.   Eyes: Negative.   Respiratory: Negative.    Cardiovascular: Negative.   Endocrine: Negative.   Musculoskeletal: Negative.   Skin: Negative.   Neurological: Negative.   Psychiatric/Behavioral: Negative.     Objective: Physical Exam Not obtained as encounter was done via telephone.   Previous notes and tests were reviewed.  I discussed the assessment and treatment plan with the patient. The patient was provided an opportunity to ask questions and all were answered. The patient agreed with the plan and demonstrated an understanding of the instructions.   The patient was advised to call back or seek an in-person evaluation if the symptoms worsen or if the condition fails to improve as  anticipated.  I provided 28 minutes of non-face-to-face time during this encounter.  It was my pleasure to participate in [place] care today. Please feel free to contact me with any questions or concerns.   Sincerely,  [name], FNP

## 2021-01-30 NOTE — Patient Instructions (Addendum)
Asthma Continue montelukast 10 mg-take 1 tablet once a day to prevent coughing or wheezing Continue albuterol HFA-2 puffs every 4 hours if needed for coughing or wheezing.   You may use albuterol 2 puffs 5 to 15 minutes before exercise .   Allergic rhinitis Begin prednisone 10 mg tablets. Take 2 tablets twice a day for 3 days, then take 2 tablets once a day for 1 day, then take 1 tablet on the 5th day, then stop  Begin Xhance 2 sprays in each nostril twice a day for nasal symptoms. This will replace Flonase Begin Mucinex (guaifenesin) [phone] mg twice a day and increase fluid intake as tolerated in order to this out mucus.  Continue an antihistamine once a day only if needed for a runny nose or itch. Remember to rotate to a different antihistamine about every 3 months. Some examples of over the counter antihistamines include Zyrtec (cetirizine), Xyzal (levocetirizine), Allegra (fexofenadine), and Claritin (loratidine).  Continue saline nasal rinses as needed for nasal symptoms. Use this before any medicated nasal sprays for best result Continue allergen avoidance measures directed toward grass pollen, weed pollen, tree pollen, dust mite, cockroach, and cat as listed below If your symptoms are not well controlled with the treatment plan above, consider allergen immunotherapy as means of long term control. - Allergy injections "re-train" and "reset" the immune system to ignore environmental allergens and decrease the resulting immune response to those allergens (sneezing, itchy watery eyes, runny nose, nasal congestion, etc).    - Allergy injections improve symptoms in 75-85% of patients.   - We can discuss this more at the next appointment if the medications are not working for you.  - Written information has been provided  Allergic conjunctivitis Some over the counter eye drops include Pataday one drop in each eye once a day as needed for red, itchy eyes OR Zaditor one drop in each eye twice a  day as needed for red itchy eyes.  Atopic dermaitits Continue a daily moisturizing routine  Food allergy Continue to avoid shellfish. In case of an allergic reaction, take Benadryl 50 mg every 4 hours, and if life-threatening symptoms occur, inject with EpiPen 0.3 mg.  Call us if you are not doing well on this treatment plan  Follow up in 2 months or sooner if needed.  Reducing Pollen Exposure The American Academy of Allergy, Asthma and Immunology suggests the following steps to reduce your exposure to pollen during allergy seasons. Do not hang sheets or clothing out to dry; pollen may collect on these items. Do not mow lawns or spend time around freshly cut grass; mowing stirs up pollen. Keep windows closed at night.  Keep car windows closed while driving. Minimize morning activities outdoors, a time when pollen counts are usually at their highest. Stay indoors as much as possible when pollen counts or humidity is high and on windy days when pollen tends to remain in the air longer. Use air conditioning when possible.  Many air conditioners have filters that trap the pollen spores. Use a HEPA room air filter to remove pollen form the indoor air you breathe.   Control of Dust Mite Allergen Dust mites play a major role in allergic asthma and rhinitis. They occur in environments with high humidity wherever human skin is found. Dust mites absorb humidity from the atmosphere (ie, they do not drink) and feed on organic matter (including shed human and animal skin). Dust mites are a microscopic type of insect that you cannot see  with the naked eye. High levels of dust mites have been detected from mattresses, pillows, carpets, upholstered furniture, bed covers, clothes, soft toys and any woven material. The principal allergen of the dust mite is found in its feces. A gram of dust may contain 1,000 mites and 250,000 fecal particles. Mite antigen is easily measured in the air during house cleaning  activities. Dust mites do not bite and do not cause harm to humans, other than by triggering allergies/asthma.  Ways to decrease your exposure to dust mites in your home:  1. Encase mattresses, box springs and pillows with a mite-impermeable barrier or cover  2. Wash sheets, blankets and drapes weekly in hot water (130 F) with detergent and dry them in a dryer on the hot setting.  3. Have the room cleaned frequently with a vacuum cleaner and a damp dust-mop. For carpeting or rugs, vacuuming with a vacuum cleaner equipped with a high-efficiency particulate air (HEPA) filter. The dust mite allergic individual should not be in a room which is being cleaned and should wait 1 hour after cleaning before going into the room.  4. Do not sleep on upholstered furniture (eg, couches).  5. If possible removing carpeting, upholstered furniture and drapery from the home is ideal. Horizontal blinds should be eliminated in the rooms where the person spends the most time (bedroom, study, television room). Washable vinyl, roller-type shades are optimal.  6. Remove all non-washable stuffed toys from the bedroom. Wash stuffed toys weekly like sheets and blankets above.  7. Reduce indoor humidity to less than 50%. Inexpensive humidity monitors can be purchased at most hardware stores. Do not use a humidifier as can make the problem worse and are not recommended.  Control of Cockroach Allergen Cockroach allergen has been identified as an important cause of acute attacks of asthma, especially in urban settings.  There are fifty-five species of cockroach that exist in the [place], however only three, the [place], [place] species produce allergen that can affect patients with Asthma.  Allergens can be obtained from fecal particles, egg casings and secretions from cockroaches.    Remove food sources. Reduce access to water. Seal access and entry points. Spray runways with 0.5-1% Diazinon or  Chlorpyrifos Blow boric acid power under stoves and refrigerator. Place bait stations (hydramethylnon) at feeding sites.   Control of Dog or Cat Allergen Avoidance is the best way to manage a dog or cat allergy. If you have a dog or cat and are allergic to dog or cats, consider removing the dog or cat from the home. If you have a dog or cat but don't want to find it a new home, or if your family wants a pet even though someone in the household is allergic, here are some strategies that may help keep symptoms at bay:  Keep the pet out of your bedroom and restrict it to only a few rooms. Be advised that keeping the dog or cat in only one room will not limit the allergens to that room. Don't pet, hug or kiss the dog or cat; if you do, wash your hands with soap and water. High-efficiency particulate air (HEPA) cleaners run continuously in a bedroom or living room can reduce allergen levels over time. Regular use of a high-efficiency vacuum cleaner or a central vacuum can reduce allergen levels. Giving your dog or cat a bath at least once a week can reduce airborne allergen.

## 2021-02-12 ENCOUNTER — Encounter: Payer: Self-pay | Admitting: Family Medicine

## 2021-02-13 NOTE — Telephone Encounter (Signed)
Can you please refer this patient to an ENT for evaluation and treatment? Thank you

## 2021-02-14 ENCOUNTER — Other Ambulatory Visit (HOSPITAL_COMMUNITY): Payer: Self-pay

## 2021-02-15 NOTE — Telephone Encounter (Signed)
Webb Silversmith, please advise on diagnosis.

## 2021-02-20 NOTE — Telephone Encounter (Signed)
Evaluation of constant pressure. Patient thinks he might have a fungal sinus condition. Thank you

## 2021-02-20 NOTE — Telephone Encounter (Signed)
Following back up on a diagnosis for this patients referral?   Thanks

## 2021-03-12 ENCOUNTER — Other Ambulatory Visit (HOSPITAL_COMMUNITY): Payer: Self-pay

## 2021-03-12 ENCOUNTER — Other Ambulatory Visit: Payer: Self-pay

## 2021-03-12 ENCOUNTER — Encounter: Payer: Self-pay | Admitting: Family Medicine

## 2021-03-12 ENCOUNTER — Ambulatory Visit (INDEPENDENT_AMBULATORY_CARE_PROVIDER_SITE_OTHER): Payer: No Typology Code available for payment source | Admitting: Family Medicine

## 2021-03-12 VITALS — BP 107/69 | HR 67 | Temp 97.2°F | Ht 72.0 in | Wt 176.2 lb

## 2021-03-12 DIAGNOSIS — R21 Rash and other nonspecific skin eruption: Secondary | ICD-10-CM | POA: Diagnosis not present

## 2021-03-12 DIAGNOSIS — Z0001 Encounter for general adult medical examination with abnormal findings: Secondary | ICD-10-CM

## 2021-03-12 DIAGNOSIS — G1220 Motor neuron disease, unspecified: Secondary | ICD-10-CM

## 2021-03-12 DIAGNOSIS — R2990 Unspecified symptoms and signs involving the nervous system: Secondary | ICD-10-CM

## 2021-03-12 MED ORDER — VALACYCLOVIR HCL 500 MG PO TABS
500.0000 mg | ORAL_TABLET | Freq: Two times a day (BID) | ORAL | 0 refills | Status: AC
  Filled 2021-03-12: qty 180, 90d supply, fill #0

## 2021-03-12 NOTE — Progress Notes (Signed)
Chief Complaint:  Scott Costa is a 30 y.o. male who presents today for his annual comprehensive physical exam.    Assessment/Plan:  Chronic Problems Addressed Today: Multiple neurological symptoms Has had extensive work-up via neurology which was negative.  Symptoms have worsened recently and has additionally had a few new symptoms.  He does have a rash which is consistent with possible shingles-this could be contributing or playing a role.  Discussed with patient that he does not have a clear diagnosis however it may be worthwhile to trial suppressive Valtrex.  Discussed with patient this is usually used for herpes however could be effective at preventing any shingles outbreak and potentially helping with some of his neurologic symptoms.  He is interested in trying for a few weeks.  We will also repeat his MRI C-spine in addition to repeating blood work per patient request.  Depending on response to Valtrex and above work-up may consider referral back to neurology or [place] [facility].  Rash Appearance on exam and dermatomal distribution-consistent with possible resolving shingles.  We will start Valtrex as above.  Preventative Healthcare: UTD on vaccines.   Patient Counseling(The following topics were reviewed and/or handout was given):  -Nutrition: Stressed importance of moderation in sodium/caffeine intake, saturated fat and cholesterol, caloric balance, sufficient intake of fresh fruits, vegetables, and fiber.  -Stressed the importance of regular exercise.   -Substance Abuse: Discussed cessation/primary prevention of tobacco, alcohol, or other drug use; driving or other dangerous activities under the influence; availability of treatment for abuse.   -Injury prevention: Discussed safety belts, safety helmets, smoke detector, smoking near bedding or upholstery.   -Sexuality: Discussed sexually transmitted diseases, partner selection, use of condoms, avoidance of unintended  pregnancy and contraceptive alternatives.   -Dental health: Discussed importance of regular tooth brushing, flossing, and dental visits.  -Health maintenance and immunizations reviewed. Please refer to Health maintenance section.  Return to care in 1 year for next preventative visit.     Subjective:  HPI:  See A/P for status of chronic conditions.  He has had continued issues with multiple neurologic symptoms.  Recently had a course of prednisone for sinus infection which exacerbated his symptoms.  Still has twitching all over his body.  He has had neurologic work-up and to see neurology.  All of this has thus far been nondiagnostic.  Additionally he notes that whenever he bends his head forward he feels a buzzing sensation in his pelvic area.  In addition to this, he complains of rash in the left abdomen region that began 6 weeks ago.  Did have some blisters to the area.  Area has improved in appearance over the last few weeks.   He has been diagnosed with shingles in the past.  He has no other acute complaints today.   Lifestyle Diet: Balanced Exercise: Regular.   Depression screen PHQ 2/9 03/12/2021  Decreased Interest 0  Down, Depressed, Hopeless 0  PHQ - 2 Score 0    Health Maintenance Due  Topic Date Due   Pneumococcal Vaccine 72-64 Years old (1 - PCV) Never done   HIV Screening  Never done   Hepatitis C Screening  Never done   INFLUENZA VACCINE  03/05/2021     ROS: Per HPI, otherwise a complete review of systems was negative.   PMH:  The following were reviewed and entered/updated in epic: Past Medical History:  Diagnosis Date   Fainting spell    Due to blood draws   Food allergy  GERD (gastroesophageal reflux disease)    Insomnia    Leukopenia    Low ferritin level    Patient Active Problem List   Diagnosis Date Noted   Anxiety 04/05/2020   Multiple neurological symptoms 04/05/2020   Reactive airway disease 01/31/2020   Seasonal allergic rhinitis due to  pollen 01/31/2020   Seasonal allergic conjunctivitis 01/31/2020   Anaphylaxis due to food, subsequent encounter 01/31/2020   Intrinsic atopic dermatitis 01/31/2020   At risk for obstructive sleep apnea 01/31/2020   Gastroesophageal reflux disease 10/11/2019   Insomnia 10/11/2019   Atypical nevus 01/22/2019   Rash 01/22/2019   Sebaceous cyst 01/22/2019   Past Surgical History:  Procedure Laterality Date   LIPOMA EXCISION     MOLE REMOVAL     UPPER GI ENDOSCOPY  12/06/2019    Family History  Problem Relation Age of Onset   Allergic rhinitis Mother    Hypertension Father    Colon cancer Paternal Grandfather 72   Emphysema Paternal Grandmother    Esophageal cancer Neg Hx    Stomach cancer Neg Hx    Rectal cancer Neg Hx    Asthma Neg Hx    Eczema Neg Hx    Urticaria Neg Hx    Immunodeficiency Neg Hx    Angioedema Neg Hx    Atopy Neg Hx     Medications- reviewed and updated Current Outpatient Medications  Medication Sig Dispense Refill   Ascorbic Acid (VITAMIN C) 1000 MG tablet Take 1,000 mg by mouth daily.     EPINEPHrine 0.3 mg/0.3 mL IJ SOAJ injection INJECT AS DIRECTED FOR SEVERE ALLERGIC REACTIONS 2 each 1   fluticasone (FLONASE) 50 MCG/ACT nasal spray Place 2 sprays into both nostrils daily as needed (for stuffy nose). 16 g 5   Fluticasone Propionate (XHANCE) 93 MCG/ACT EXHU Place 2 puffs into the nose 2 (two) times daily. 32 mL 5   Magnesium 125 MG CAPS Take 120 mg by mouth 2 (two) times daily.     montelukast (SINGULAIR) 10 MG tablet Take 1 tablet (10 mg total) by mouth at bedtime. 30 tablet 5   triamcinolone cream (KENALOG) 0.1 % Use twice a day if needed to red itchy areas below the face 45 g 3   valACYclovir (VALTREX) 500 MG tablet Take 1 tablet by mouth 2 times daily as directed. 180 tablet 0   No current facility-administered medications for this visit.    Allergies-reviewed and updated Allergies  Allergen Reactions   Shellfish Allergy Anaphylaxis    Covid-19 Mrna Vaccine [employer]) [Covid-19 Mrna Vacc (Moderna)]    Nickel     Social History   Socioeconomic History   Marital status: Single    Spouse name: Not on file   Number of children: 0   Years of education: Not on file   Highest education level: Not on file  Occupational History   Occupation: CMA  Tobacco Use   Smoking status: Former    Packs/day: 0.40    Years: 5.00    Pack years: 2.00    Types: Cigarettes    Start date: 2006    Quit date: 2014    Years since quitting: 8.6   Smokeless tobacco: Never  Vaping Use   Vaping Use: Never used  Substance and Sexual Activity   Alcohol use: Not Currently    Comment: occasional; hasn't had a drink since about aug 2021, worsens symptoms    Drug use: Never   Sexual activity: Not on file  Other Topics  Concern   Not on file  Social History Narrative   Lives at home with partner    Right handed   Caffeine: none currently   Social Determinants of Health   Financial Resource Strain: Not on file  Food Insecurity: Not on file  Transportation Needs: Not on file  Physical Activity: Not on file  Stress: Not on file  Social Connections: Not on file        Objective:  Physical Exam: BP 107/69   Pulse 67   Temp (!) 97.2 F (36.2 C) (Temporal)   Ht 6' (1.829 m)   Wt 176 lb 3.2 oz (79.9 kg)   SpO2 100%   BMI 23.90 kg/m   Body mass index is 23.9 kg/m. Wt Readings from Last 3 Encounters:  03/12/21 176 lb 3.2 oz (79.9 kg)  12/25/20 165 lb (74.8 kg)  10/31/20 166 lb 3.2 oz (75.4 kg)   Gen: NAD, resting comfortably HEENT: TMs normal bilaterally. OP clear. No thyromegaly noted.  CV: RRR with no murmurs appreciated Pulm: NWOB, CTAB with no crackles, wheezes, or rhonchi GI: Normal bowel sounds present. Soft, Nontender, Nondistended. MSK: no edema, cyanosis, or clubbing noted Skin: warm, dry.  Dermatomal erythematous appearing rash on left flank. Neuro: CN2-12 grossly intact. Strength 5/5 in upper and lower extremities.  Reflexes symmetric and intact bilaterally.  Psych: Normal affect and thought content          I,Savera Zaman,acting as a scribe for Dimas Chyle, MD.,have documented all relevant documentation on the behalf of Dimas Chyle, MD,as directed by  Dimas Chyle, MD while in the presence of Dimas Chyle, MD.  I, Dimas Chyle, MD, have reviewed all documentation for this visit. The documentation on 03/12/21 for the exam, diagnosis, procedures, and orders are all accurate and complete.  Algis Greenhouse. [name], MD 03/12/2021 8:48 AM

## 2021-03-12 NOTE — Assessment & Plan Note (Signed)
Appearance on exam and dermatomal distribution-consistent with possible resolving shingles.  We will start Valtrex as above.

## 2021-03-12 NOTE — Assessment & Plan Note (Signed)
Has had extensive work-up via neurology which was negative.  Symptoms have worsened recently and has additionally had a few new symptoms.  He does have a rash which is consistent with possible shingles-this could be contributing or playing a role.  Discussed with patient that he does not have a clear diagnosis however it may be worthwhile to trial suppressive Valtrex.  Discussed with patient this is usually used for herpes however could be effective at preventing any shingles outbreak and potentially helping with some of his neurologic symptoms.  He is interested in trying for a few weeks.  We will also repeat his MRI C-spine in addition to repeating blood work per patient request.  Depending on response to Valtrex and above work-up may consider referral back to neurology or [facility].

## 2021-03-12 NOTE — Patient Instructions (Signed)
It was very nice to see you today!  It is possible that recurrent shingles could be explaining your symptoms.  Placed on Valtrex.  We will check blood work and an MRI of your neck.  Take care, Dr [name]  PLEASE NOTE:  If you had any lab tests please let us know if you have not heard back within a few days. You may see your results on mychart before we have a chance to review them but we will give you a call once they are reviewed by [name]. If we ordered any referrals today, please let us know if you have not heard from their office within the next week.   Please try these tips to maintain a healthy lifestyle:  Eat at least 3 REAL meals and 1-2 snacks per day.  Aim for no more than 5 hours between eating.  If you eat breakfast, please do so within one hour of getting up.   Each meal should contain half fruits/vegetables, one quarter protein, and one quarter carbs (n+o bigger than a computer mouse)  Cut down on sweet beverages. This includes juice, soda, and sweet tea.   Drink at least 1 glass of water with each meal and aim for at least 8 glasses per day  Exercise at least 150 minutes every week.    Preventive Care 68-84 Years Old, Male Preventive care refers to lifestyle choices and visits with your health care provider that can promote health and wellness. This includes: A yearly physical exam. This is also called an annual wellness visit. Regular dental and eye exams. Immunizations. Screening for certain conditions. Healthy lifestyle choices, such as: Eating a healthy diet. Getting regular exercise. Not using drugs or products that contain nicotine and tobacco. Limiting alcohol use. What can I expect for my preventive care visit? Physical exam Your health care provider may check your: Height and weight. These may be used to calculate your BMI (body mass index). BMI is a measurement that tells if you are at a healthy weight. Heart rate and blood pressure. Body  temperature. Skin for abnormal spots. Counseling Your health care provider may ask you questions about your: Past medical problems. Family's medical history. Alcohol, tobacco, and drug use. Emotional well-being. Home life and relationship well-being. Sexual activity. Diet, exercise, and sleep habits. Work and work Statistician. Access to firearms. What immunizations do I need?  Vaccines are usually given at various ages, according to a schedule. Your health care provider will recommend vaccines for you based on your age, medicalhistory, and lifestyle or other factors, such as travel or where you work. What tests do I need? Blood tests Lipid and cholesterol levels. These may be checked every 5 years starting at age 24. Hepatitis C test. Hepatitis B test. Screening  Diabetes screening. This is done by checking your blood sugar (glucose) after you have not eaten for a while (fasting). Genital exam to check for testicular cancer or hernias. STD (sexually transmitted disease) testing, if you are at risk. Talk with your health care provider about your test results, treatment options,and if necessary, the need for more tests. Follow these instructions at home: Eating and drinking  Eat a healthy diet that includes fresh fruits and vegetables, whole grains, lean protein, and low-fat dairy products. Drink enough fluid to keep your urine pale yellow. Take vitamin and mineral supplements as recommended by your health care provider. Do not drink alcohol if your health care provider tells you not to drink. If you drink alcohol: Limit  how much you have to 0-2 drinks a day. Be aware of how much alcohol is in your drink. In the [place] one drink equals one 12 oz bottle of beer (355 mL), one 5 oz glass of wine (148 mL), or one 1 oz glass of hard liquor (44 mL).  Lifestyle Take daily care of your teeth and gums. Brush your teeth every morning and night with fluoride toothpaste. Floss one time each  day. Stay active. Exercise for at least 30 minutes 5 or more days each week. Do not use any products that contain nicotine or tobacco, such as cigarettes, e-cigarettes, and chewing tobacco. If you need help quitting, ask your health care provider. Do not use drugs. If you are sexually active, practice safe sex. Use a condom or other form of protection to prevent STIs (sexually transmitted infections). Find healthy ways to cope with stress, such as: Meditation, yoga, or listening to music. Journaling. Talking to a trusted person. Spending time with friends and family. Safety Always wear your seat belt while driving or riding in a vehicle. Do not drive: If you have been drinking alcohol. Do not ride with someone who has been drinking. When you are tired or distracted. While texting. Wear a helmet and other protective equipment during sports activities. If you have firearms in your house, make sure you follow all gun safety procedures. Seek help if you have been physically or sexually abused. What's next? Go to your health care provider once a year for an annual wellness visit. Ask your health care provider how often you should have your eyes and teeth checked. Stay up to date on all vaccines. This information is not intended to replace advice given to you by your health care provider. Make sure you discuss any questions you have with your healthcare provider. Document Revised: 04/07/2019 Document Reviewed: 07/16/2018 Elsevier Patient Education  2022 [employer].

## 2021-03-14 ENCOUNTER — Other Ambulatory Visit (HOSPITAL_COMMUNITY): Payer: Self-pay

## 2021-03-19 ENCOUNTER — Other Ambulatory Visit (INDEPENDENT_AMBULATORY_CARE_PROVIDER_SITE_OTHER): Payer: No Typology Code available for payment source

## 2021-03-19 ENCOUNTER — Institutional Professional Consult (permissible substitution): Payer: No Typology Code available for payment source | Admitting: Neurology

## 2021-03-19 ENCOUNTER — Other Ambulatory Visit: Payer: Self-pay

## 2021-03-19 DIAGNOSIS — G1220 Motor neuron disease, unspecified: Secondary | ICD-10-CM | POA: Diagnosis not present

## 2021-03-19 LAB — CBC
HCT: 42.6 % (ref 39.0–52.0)
Hemoglobin: 14 g/dL (ref 13.0–17.0)
MCHC: 32.9 g/dL (ref 30.0–36.0)
MCV: 80.3 fl (ref 78.0–100.0)
Platelets: 249 10*3/uL (ref 150.0–400.0)
RBC: 5.31 Mil/uL (ref 4.22–5.81)
RDW: 13.6 % (ref 11.5–15.5)
WBC: 4 10*3/uL (ref 4.0–10.5)

## 2021-03-19 LAB — IBC + FERRITIN
Ferritin: 112.9 ng/mL (ref 22.0–322.0)
Iron: 123 ug/dL (ref 42–165)
Saturation Ratios: 35.7 % (ref 20.0–50.0)
TIBC: 344.4 ug/dL (ref 250.0–450.0)
Transferrin: 246 mg/dL (ref 212.0–360.0)

## 2021-03-20 NOTE — Progress Notes (Signed)
Please inform patient of the following:  Blood work is normal.

## 2021-03-29 ENCOUNTER — Other Ambulatory Visit: Payer: Self-pay | Admitting: *Deleted

## 2021-03-29 DIAGNOSIS — K909 Intestinal malabsorption, unspecified: Secondary | ICD-10-CM

## 2021-03-29 DIAGNOSIS — K219 Gastro-esophageal reflux disease without esophagitis: Secondary | ICD-10-CM

## 2021-04-02 ENCOUNTER — Ambulatory Visit (INDEPENDENT_AMBULATORY_CARE_PROVIDER_SITE_OTHER): Payer: No Typology Code available for payment source | Admitting: Otolaryngology

## 2021-04-04 ENCOUNTER — Encounter: Payer: Self-pay | Admitting: Family Medicine

## 2021-04-05 NOTE — Telephone Encounter (Signed)
Lattie Haw, can you look into this please.

## 2021-04-10 ENCOUNTER — Other Ambulatory Visit: Payer: Self-pay

## 2021-04-10 ENCOUNTER — Ambulatory Visit (INDEPENDENT_AMBULATORY_CARE_PROVIDER_SITE_OTHER): Payer: No Typology Code available for payment source | Admitting: Physician Assistant

## 2021-04-10 ENCOUNTER — Ambulatory Visit (INDEPENDENT_AMBULATORY_CARE_PROVIDER_SITE_OTHER)
Admission: RE | Admit: 2021-04-10 | Discharge: 2021-04-10 | Disposition: A | Discharge status: Home / Self Care | Payer: No Typology Code available for payment source | Source: Ambulatory Visit | Attending: Physician Assistant | Admitting: Physician Assistant

## 2021-04-10 ENCOUNTER — Encounter: Payer: Self-pay | Admitting: Physician Assistant

## 2021-04-10 VITALS — BP 135/90 | HR 105 | Temp 97.2°F | Ht 72.0 in | Wt 178.2 lb

## 2021-04-10 DIAGNOSIS — R0781 Pleurodynia: Secondary | ICD-10-CM

## 2021-04-10 NOTE — Patient Instructions (Signed)
Your EKG looked great, no concerns. Let's get a Chest XRAY and then plan from there. Of course go to the ED in case of any sudden severe chest pain or shortness of breath.

## 2021-04-10 NOTE — Progress Notes (Signed)
Acute Office Visit  Subjective:    Patient ID: Scott Costa, male    DOB: May 09, 1991, 30 y.o.   MRN: [id number]  Chief Complaint  Patient presents with   Chest Pain    Chest Pain  Pertinent negatives include no abdominal pain, back pain, cough, diaphoresis, fever, nausea, palpitations, shortness of breath or vomiting.  Patient is in today for chest pain. One week ago rolled over in bed and felt sharp pain in left side of chest. Also has this pain if he lies on his back. He has been treating Ibuprofen 800 mg TID x 3 days, without relief. Feels sharp pain with deep breathing. No known injury. No heavy lifting or falls. Sitting upright, he doesn't really notice it. No pain when he lies on his right side.  Former smoker, quit at age 60. Stays physically active. No hx of high cholesterol or HTN. No significant cardiac family history.   Past Medical History:  Diagnosis Date   Fainting spell    Due to blood draws   Food allergy    GERD (gastroesophageal reflux disease)    Insomnia    Leukopenia    Low ferritin level     Past Surgical History:  Procedure Laterality Date   LIPOMA EXCISION     MOLE REMOVAL     UPPER GI ENDOSCOPY  12/06/2019    Family History  Problem Relation Age of Onset   Allergic rhinitis Mother    Hypertension Father    Colon cancer Paternal Grandfather 28   Emphysema Paternal Grandmother    Esophageal cancer Neg Hx    Stomach cancer Neg Hx    Rectal cancer Neg Hx    Asthma Neg Hx    Eczema Neg Hx    Urticaria Neg Hx    Immunodeficiency Neg Hx    Angioedema Neg Hx    Atopy Neg Hx     Social History   Socioeconomic History   Marital status: Single    Spouse name: Not on file   Number of children: 0   Years of education: Not on file   Highest education level: Not on file  Occupational History   Occupation: CMA  Tobacco Use   Smoking status: Former    Packs/day: 0.40    Years: 5.00    Pack years: 2.00    Types: Cigarettes    Start  date: 2006    Quit date: 2014    Years since quitting: 8.6   Smokeless tobacco: Never  Vaping Use   Vaping Use: Never used  Substance and Sexual Activity   Alcohol use: Not Currently    Comment: occasional; hasn't had a drink since about aug 2021, worsens symptoms    Drug use: Never   Sexual activity: Not on file  Other Topics Concern   Not on file  Social History Narrative   Lives at home with partner    Right handed   Caffeine: none currently   Social Determinants of Health   Financial Resource Strain: Not on file  Food Insecurity: Not on file  Transportation Needs: Not on file  Physical Activity: Not on file  Stress: Not on file  Social Connections: Not on file  Intimate Partner Violence: Not on file    Outpatient Medications Prior to Visit  Medication Sig Dispense Refill   Ascorbic Acid (VITAMIN C) 1000 MG tablet Take 1,000 mg by mouth daily.     EPINEPHrine 0.3 mg/0.3 mL IJ SOAJ injection INJECT AS  DIRECTED FOR SEVERE ALLERGIC REACTIONS 2 each 1   fluticasone (FLONASE) 50 MCG/ACT nasal spray Place 2 sprays into both nostrils daily as needed (for stuffy nose). 16 g 5   Fluticasone Propionate (XHANCE) 93 MCG/ACT EXHU Place 2 puffs into the nose 2 (two) times daily. 32 mL 5   Magnesium 125 MG CAPS Take 120 mg by mouth 2 (two) times daily.     montelukast (SINGULAIR) 10 MG tablet Take 1 tablet (10 mg total) by mouth at bedtime. 30 tablet 5   triamcinolone cream (KENALOG) 0.1 % Use twice a day if needed to red itchy areas below the face 45 g 3   valACYclovir (VALTREX) 500 MG tablet Take 1 tablet by mouth 2 times daily as directed. 180 tablet 0   No facility-administered medications prior to visit.    Allergies  Allergen Reactions   Shellfish Allergy Anaphylaxis   Covid-19 Mrna Vaccine [employer]) [Covid-19 Mrna Vacc (Moderna)]    Nickel     Review of Systems  Constitutional:  Negative for activity change, diaphoresis, fatigue and fever.  Respiratory:  Negative for  cough, chest tightness, shortness of breath and wheezing.   Cardiovascular:  Positive for chest pain. Negative for palpitations and leg swelling.  Gastrointestinal:  Negative for abdominal pain, nausea and vomiting.  Genitourinary:  Negative for flank pain.  Musculoskeletal:  Negative for back pain.  Skin:  Negative for rash.  Psychiatric/Behavioral:  Negative for confusion.       Objective:    Physical Exam Vitals and nursing note reviewed.  Constitutional:      General: He is not in acute distress.    Appearance: Normal appearance. He is not toxic-appearing.  HENT:     Head: Normocephalic and atraumatic.     Right Ear: Tympanic membrane, ear canal and external ear normal.     Left Ear: Tympanic membrane, ear canal and external ear normal.     Nose: Nose normal.     Mouth/Throat:     Mouth: Mucous membranes are moist.     Pharynx: Oropharynx is clear.  Eyes:     Extraocular Movements: Extraocular movements intact.     Conjunctiva/sclera: Conjunctivae normal.     Pupils: Pupils are equal, round, and reactive to light.  Cardiovascular:     Rate and Rhythm: Normal rate and regular rhythm.     Pulses: Normal pulses.     Heart sounds: Normal heart sounds.  Pulmonary:     Effort: Pulmonary effort is normal.     Breath sounds: Normal breath sounds.  Chest:     Chest wall: No mass or tenderness.  Abdominal:     General: Abdomen is flat. Bowel sounds are normal.     Palpations: Abdomen is soft.     Tenderness: There is no abdominal tenderness.  Musculoskeletal:        General: Normal range of motion.     Cervical back: Normal range of motion and neck supple.  Skin:    General: Skin is warm and dry.  Neurological:     General: No focal deficit present.     Mental Status: He is alert and oriented to person, place, and time.  Psychiatric:        Mood and Affect: Mood normal.        Behavior: Behavior normal.    BP 135/90   Pulse (!) 105   Temp (!) 97.2 F (36.2 C)   Ht  6' (1.829 m)   Wt 178 lb  3.2 oz (80.8 kg)   SpO2 100%   BMI 24.17 kg/m  Wt Readings from Last 3 Encounters:  04/10/21 178 lb 3.2 oz (80.8 kg)  03/12/21 176 lb 3.2 oz (79.9 kg)  12/25/20 165 lb (74.8 kg)    Health Maintenance Due  Topic Date Due   Pneumococcal Vaccine 12-68 Years old (1 - PCV) Never done   HIV Screening  Never done   Hepatitis C Screening  Never done   INFLUENZA VACCINE  03/05/2021    There are no preventive care reminders to display for this patient.   Lab Results  Component Value Date   TSH 1.51 03/07/2020   Lab Results  Component Value Date   WBC 4.0 03/19/2021   HGB 14.0 03/19/2021   HCT 42.6 03/19/2021   MCV 80.3 03/19/2021   PLT 249.0 03/19/2021   Lab Results  Component Value Date   NA 141 04/05/2020   K 3.9 04/05/2020   CO2 28 04/05/2020   GLUCOSE 80 04/05/2020   BUN 10 04/05/2020   CREATININE 0.81 04/05/2020   BILITOT 0.9 03/07/2020   AST 11 03/07/2020   ALT 7 (L) 03/07/2020   PROT 6.9 03/07/2020   CALCIUM 9.6 04/05/2020   Lab Results  Component Value Date   CHOL 181 03/07/2020   Lab Results  Component Value Date   HDL 75 03/07/2020   Lab Results  Component Value Date   LDLCALC 91 03/07/2020   Lab Results  Component Value Date   TRIG 65 03/07/2020   Lab Results  Component Value Date   CHOLHDL 2.4 03/07/2020   No results found for: HGBA1C     Assessment & Plan:   Problem List Items Addressed This Visit   None Visit Diagnoses     Chest pain, unspecified type    -  Primary   Relevant Orders   EKG 12-Lead (Completed)      1. Pleuritic chest pain His EDACS Score is 4 (pain worsened with deep inspiration), which puts him at low risk. EKG showed NSR without any ST or T wave changes. No signs of ischemia. Will obtain outpatient chest x-ray at this time.  I certainly see no signs to have him evaluated at the emergency department at this time.  He knows to go straight there if he has sudden worsening chest pain or  pressure, shortness of breath, or other acute symptoms.  Pending his chest x-ray, we will determine plan of action for him.  I discussed possible use of steroids since the ibuprofen does not seem to be helping, which she declined at this time due to side effects.  I will be sure to talk with his PCP once the chest x-ray is back as well.  This note was prepared with assistance of Systems analyst. Occasional wrong-word or sound-a-like substitutions may have occurred due to the inherent limitations of voice recognition software.   Ravin Bendall M Cesar Alf, PA-C

## 2021-04-17 ENCOUNTER — Encounter: Payer: Self-pay | Admitting: Family Medicine

## 2021-04-18 ENCOUNTER — Other Ambulatory Visit (HOSPITAL_COMMUNITY): Payer: Self-pay

## 2021-04-24 ENCOUNTER — Emergency Department (HOSPITAL_COMMUNITY): Payer: No Typology Code available for payment source

## 2021-04-24 ENCOUNTER — Other Ambulatory Visit: Payer: Self-pay

## 2021-04-24 ENCOUNTER — Emergency Department (HOSPITAL_COMMUNITY)
Admission: EM | Admit: 2021-04-24 | Discharge: 2021-04-25 | Disposition: A | Discharge status: Home / Self Care | Payer: No Typology Code available for payment source | Attending: Emergency Medicine | Admitting: Emergency Medicine

## 2021-04-24 ENCOUNTER — Encounter (HOSPITAL_COMMUNITY): Payer: Self-pay | Admitting: Emergency Medicine

## 2021-04-24 DIAGNOSIS — R079 Chest pain, unspecified: Secondary | ICD-10-CM | POA: Diagnosis not present

## 2021-04-24 DIAGNOSIS — Z87891 Personal history of nicotine dependence: Secondary | ICD-10-CM | POA: Diagnosis not present

## 2021-04-24 LAB — CBC
HCT: 44.7 % (ref 39.0–52.0)
Hemoglobin: 14.6 g/dL (ref 13.0–17.0)
MCH: 26.6 pg (ref 26.0–34.0)
MCHC: 32.7 g/dL (ref 30.0–36.0)
MCV: 81.4 fL (ref 80.0–100.0)
Platelets: 247 10*3/uL (ref 150–400)
RBC: 5.49 MIL/uL (ref 4.22–5.81)
RDW: 12.8 % (ref 11.5–15.5)
WBC: 4.9 10*3/uL (ref 4.0–10.5)
nRBC: 0 % (ref 0.0–0.2)

## 2021-04-24 LAB — BASIC METABOLIC PANEL
Anion gap: 8 (ref 5–15)
BUN: 13 mg/dL (ref 6–20)
CO2: 27 mmol/L (ref 22–32)
Calcium: 9.3 mg/dL (ref 8.9–10.3)
Chloride: 104 mmol/L (ref 98–111)
Creatinine, Ser: 0.7 mg/dL (ref 0.61–1.24)
GFR, Estimated: >60 mL/min (ref >60)
Glucose, Bld: 133 mg/dL — ABNORMAL HIGH (ref 70–99)
Potassium: 3.6 mmol/L (ref 3.5–5.1)
Sodium: 139 mmol/L (ref 135–145)

## 2021-04-24 LAB — D-DIMER, QUANTITATIVE: D-Dimer, Quant: <0.27 ug/mL-FEU (ref 0.00–0.50)

## 2021-04-24 LAB — TROPONIN I (HIGH SENSITIVITY)
Troponin I (High Sensitivity): <2 ng/L (ref <18)
Troponin I (High Sensitivity): <2 ng/L (ref <18)

## 2021-04-24 NOTE — ED Triage Notes (Signed)
Pt states he has had chest pain for the past month. Pt states the pain got suddenly worse this morning. Pt states the pain is in the left side of his chest and central chest.

## 2021-04-24 NOTE — ED Provider Notes (Signed)
Emergency Medicine Provider Triage Evaluation Note  Scott Costa , a 30 y.o. male  was evaluated in triage.  Pt complains of 1 month history of substernal chest pain.  Worse with respirations and when lying in certain positions.  Has been worsening today which prompted his arrival.  No fever, chills, shortness of breath, leg pain, leg swelling.  Review of Systems  Positive:  Negative: See above   Physical Exam  BP (!) 143/105   Pulse 93   Temp 98.9 F (37.2 C) (Oral)   Resp 20   Ht 6' (1.829 m)   Wt 80.8 kg   SpO2 100%   BMI 24.17 kg/m  Gen:   Awake, no distress   Resp:  Normal effort  MSK:   Moves extremities without difficulty  Other:  No friction rub  Medical Decision Making  Medically screening exam initiated at 7:40 PM.  Appropriate orders placed.  Larue Lightner was informed that the remainder of the evaluation will be completed by another provider, this initial triage assessment does not replace that evaluation, and the importance of remaining in the ED until their evaluation is complete.     Myna Bright Underwood, PA-C 04/24/21 1944    Regan Lemming, MD 04/25/21 [phone]

## 2021-04-25 ENCOUNTER — Ambulatory Visit (INDEPENDENT_AMBULATORY_CARE_PROVIDER_SITE_OTHER): Payer: No Typology Code available for payment source | Admitting: Physician Assistant

## 2021-04-25 VITALS — BP 111/75 | HR 74 | Temp 98.0°F | Ht 72.0 in | Wt 177.2 lb

## 2021-04-25 DIAGNOSIS — R0781 Pleurodynia: Secondary | ICD-10-CM

## 2021-04-25 MED ORDER — METHOCARBAMOL 500 MG PO TABS: 500.0000 mg | ORAL_TABLET | Freq: Three times a day (TID) | ORAL | 0 refills | Status: AC | PRN

## 2021-04-25 MED ORDER — MELOXICAM 15 MG PO TABS: 15.0000 mg | ORAL_TABLET | Freq: Every day | ORAL | 0 refills | Status: AC

## 2021-04-25 NOTE — Discharge Instructions (Addendum)
You were seen in the emergency department today for chest pain. Your work-up in the emergency department has been overall reassuring. Your labs have been fairly normal and or similar to previous blood work you have had done. Your EKG and the enzyme we use to check your heart did not show an acute heart attack at this time. Your chest x-ray was normal.  Your D-dimer test to look for a blood clot was normal.  We are sending you home with the following medicines to help with your symptoms: - Mobic- this is a nonsteroidal anti-inflammatory medication that will help with pain and swelling. Be sure to take this medication as prescribed with food, 1 pill every 24 hours,  It should be taken with food, as it can cause stomach upset, and more seriously, stomach bleeding. Do not take other nonsteroidal anti-inflammatory medications with this such as Advil, Motrin, Aleve, Meloxicam, Goodie Powder, Motrin, etc. As they are similar.   - Robaxin is the muscle relaxer I have prescribed, this is meant to help with muscle tightness. Be aware that this medication may make you drowsy therefore the first time you take this it should be at a time you are in an environment where you can rest. Do not drive or operate heavy machinery when taking this medication. Do not drink alcohol or take other sedating medications with this medicine such as narcotics or benzodiazepines.   You make take Tylenol per over the counter dosing with these medications.   We have prescribed you new medication(s) today. Discuss the medications prescribed today with your pharmacist as they can have adverse effects and interactions with your other medicines including over the counter and prescribed medications. Seek medical evaluation if you start to experience new or abnormal symptoms after taking one of these medicines, seek care immediately if you start to experience difficulty breathing, feeling of your throat closing, facial swelling, or rash as these  could be indications of a more serious allergic reaction   We would like you to follow up closely with your primary care provider and/or the cardiologist provided in your discharge instructions within 1-3 days. Return to the ER immediately should you experience any new or worsening symptoms including but not limited to return of pain, worsened pain, vomiting, shortness of breath, dizziness, lightheadedness, passing out, or any other concerns that you may have.

## 2021-04-25 NOTE — Patient Instructions (Signed)
I spoke with Dr. [name]. Please try the Robaxin and Meloxicam over the next week. Call with update next week. Back to ED if any acute worsening change in symptoms.

## 2021-04-25 NOTE — Progress Notes (Signed)
Subjective:    Patient ID: Scott Costa, male    DOB: [birth year], 30 y.o.   MRN: [id number]  Chief Complaint  Patient presents with   Follow-up    HPI Patient is in today for follow up of chest pain x 1 month. He was seen in the ED yesterday. Work-up was negative. Discharged with Robaxin and Meloxicam, but says he hasn't picked up those medications yet.   Still complaining of central chest pain. Aching, 2/10 at rest. 6/10 with leaning forward or deep breathing. Leaning forward, lying on his sides worsens the pain. Sitting and standing upright helps.   No SOB or cough. No pain with palpation. No N/V. No dizziness.   Says he was thought to have GERD, but upper endoscopy in 2021 disproved this.  He has seen asthma and allergy in 01/2021 - dx with mild int reactive airway disease and allergies. Worse in the spring. Takes Singulair daily. Rescue inhaler made cough worse at that time.   Past Medical History:  Diagnosis Date   Fainting spell    Due to blood draws   Food allergy    GERD (gastroesophageal reflux disease)    Insomnia    Leukopenia    Low ferritin level     Past Surgical History:  Procedure Laterality Date   LIPOMA EXCISION     MOLE REMOVAL     UPPER GI ENDOSCOPY  12/06/2019    Family History  Problem Relation Age of Onset   Allergic rhinitis Mother    Hypertension Father    Colon cancer Paternal Grandfather 86   Emphysema Paternal Grandmother    Esophageal cancer Neg Hx    Stomach cancer Neg Hx    Rectal cancer Neg Hx    Asthma Neg Hx    Eczema Neg Hx    Urticaria Neg Hx    Immunodeficiency Neg Hx    Angioedema Neg Hx    Atopy Neg Hx     Social History   Tobacco Use   Smoking status: Former    Packs/day: 0.40    Years: 5.00    Pack years: 2.00    Types: Cigarettes    Start date: 2006    Quit date: 2014    Years since quitting: 8.7   Smokeless tobacco: Never  Vaping Use   Vaping Use: Never used  Substance Use Topics   Alcohol use:  Not Currently    Comment: occasional; hasn't had a drink since about aug 2021, worsens symptoms    Drug use: Never     Allergies  Allergen Reactions   Shellfish Allergy Anaphylaxis   Covid-19 Mrna Vaccine [employer]) [Covid-19 Mrna Vacc (Moderna)]    Nickel    Prednisone Other (See Comments)    General malaise, increases twitching and paraesthesias.     Review of Systems REFER TO HPI FOR PERTINENT POSITIVES AND NEGATIVES      Objective:     BP 111/75   Pulse 74   Temp 98 F (36.7 C)   Ht 6' (1.829 m)   Wt 177 lb 3.2 oz (80.4 kg)   SpO2 99%   BMI 24.03 kg/m   Wt Readings from Last 3 Encounters:  04/25/21 177 lb 3.2 oz (80.4 kg)  04/24/21 178 lb 3.2 oz (80.8 kg)  04/10/21 178 lb 3.2 oz (80.8 kg)    BP Readings from Last 3 Encounters:  04/25/21 111/75  04/25/21 121/84  04/10/21 135/90     Physical Exam Vitals and nursing  note reviewed.  Constitutional:      General: He is not in acute distress.    Appearance: Normal appearance. He is not toxic-appearing.  HENT:     Head: Normocephalic and atraumatic.     Right Ear: Tympanic membrane, ear canal and external ear normal.     Left Ear: Tympanic membrane, ear canal and external ear normal.     Nose: Nose normal.     Mouth/Throat:     Mouth: Mucous membranes are moist.     Pharynx: Oropharynx is clear.  Eyes:     Extraocular Movements: Extraocular movements intact.     Conjunctiva/sclera: Conjunctivae normal.     Pupils: Pupils are equal, round, and reactive to light.  Cardiovascular:     Rate and Rhythm: Normal rate and regular rhythm.     Pulses: Normal pulses.     Heart sounds: Normal heart sounds.  Pulmonary:     Effort: Pulmonary effort is normal.     Breath sounds: Normal breath sounds.  Chest:     Chest wall: No mass or tenderness.    Abdominal:     General: Abdomen is flat. Bowel sounds are normal.     Palpations: Abdomen is soft.     Tenderness: There is no abdominal tenderness.   Musculoskeletal:        General: Normal range of motion.     Cervical back: Normal range of motion and neck supple.  Skin:    General: Skin is warm and dry.  Neurological:     General: No focal deficit present.     Mental Status: He is alert and oriented to person, place, and time.  Psychiatric:        Mood and Affect: Mood normal.        Behavior: Behavior normal.       Assessment & Plan:   Problem List Items Addressed This Visit   None Visit Diagnoses     Pleuritic chest pain    -  Primary      1. Pleuritic chest pain I personally reviewed his ED report from last night.  Labs, EKG, chest x-ray were all reassuring.  I discussed his case with his PCP Dr. [name] in office today.  Sounds like it is mostly inflammatory in nature.  He agrees to have the patient start on the Robaxin and meloxicam and to try this out for the next week.  He is going to call us next week with an update.  He also needs to rest and avoid any activities that will put stress on the chest wall.  He knows to return to the emergency department for any sudden worsening symptoms or any changes.   This note was prepared with assistance of Systems analyst. Occasional wrong-word or sound-a-like substitutions may have occurred due to the inherent limitations of voice recognition software.  Time Spent: 35 minutes of total time was spent on the date of the encounter performing the following actions: chart review prior to seeing the patient, obtaining history, performing a medically necessary exam, counseling on the treatment plan, placing orders, and documenting in our EHR.    Mallissa Lorenzen M Danee Soller, PA-C

## 2021-04-25 NOTE — ED Provider Notes (Signed)
[facility] DEPT Provider Note   CSN: [id number] Arrival date & time: 04/24/21  1837     History Chief Complaint  Patient presents with   Chest Pain    Scott Costa is a 30 y.o. male with a history of reactive airway disease who presents to the emergency department with complaints of chest pain x 1 month.  Patient states that the pain is located to the central chest, somewhat to the left chest as well, initially was intermittent and seem to be getting better with 800 mg ibuprofen 3 times daily, however then worsened over the past few days prompting emergency department visit.  Pain initially was only with deep breathing and certain movements/positions, now it is fairly constant and aggravated by these activities.  There is not one position that seems to trigger the pain the most.  He has been seen by his primary care provider for this problem.  He denies fever, vomiting, shortness of breath, leg pain/swelling, hemoptysis, recent surgery/trauma, recent long travel, hormone use, personal hx of cancer, or hx of DVT/PE.    HPI     Past Medical History:  Diagnosis Date   Fainting spell    Due to blood draws   Food allergy    GERD (gastroesophageal reflux disease)    Insomnia    Leukopenia    Low ferritin level     Patient Active Problem List   Diagnosis Date Noted   Anxiety 04/05/2020   Multiple neurological symptoms 04/05/2020   Reactive airway disease 01/31/2020   Seasonal allergic rhinitis due to pollen 01/31/2020   Seasonal allergic conjunctivitis 01/31/2020   Anaphylaxis due to food, subsequent encounter 01/31/2020   Intrinsic atopic dermatitis 01/31/2020   At risk for obstructive sleep apnea 01/31/2020   Gastroesophageal reflux disease 10/11/2019   Insomnia 10/11/2019   Atypical nevus 01/22/2019   Rash 01/22/2019   Sebaceous cyst 01/22/2019    Past Surgical History:  Procedure Laterality Date   LIPOMA EXCISION     MOLE REMOVAL      UPPER GI ENDOSCOPY  12/06/2019       Family History  Problem Relation Age of Onset   Allergic rhinitis Mother    Hypertension Father    Colon cancer Paternal Grandfather 86   Emphysema Paternal Grandmother    Esophageal cancer Neg Hx    Stomach cancer Neg Hx    Rectal cancer Neg Hx    Asthma Neg Hx    Eczema Neg Hx    Urticaria Neg Hx    Immunodeficiency Neg Hx    Angioedema Neg Hx    Atopy Neg Hx     Social History   Tobacco Use   Smoking status: Former    Packs/day: 0.40    Years: 5.00    Pack years: 2.00    Types: Cigarettes    Start date: 2006    Quit date: 2014    Years since quitting: 8.7   Smokeless tobacco: Never  Vaping Use   Vaping Use: Never used  Substance Use Topics   Alcohol use: Not Currently    Comment: occasional; hasn't had a drink since about aug 2021, worsens symptoms    Drug use: Never    Home Medications Prior to Admission medications   Medication Sig Start Date End Date Taking? Authorizing Provider  Ascorbic Acid (VITAMIN C) 1000 MG tablet Take 1,000 mg by mouth daily.    [provider]  EPINEPHrine 0.3 mg/0.3 mL IJ SOAJ injection  INJECT AS DIRECTED FOR SEVERE ALLERGIC REACTIONS 09/12/20 09/12/21  Dara Hoyer, FNP  fluticasone [facility]) 50 MCG/ACT nasal spray Place 2 sprays into both nostrils daily as needed (for stuffy nose). 12/28/19   Charlies Silvers, MD  Fluticasone Propionate (XHANCE) 93 MCG/ACT EXHU Place 2 puffs into the nose 2 (two) times daily. 01/30/21   Dara Hoyer, FNP  Magnesium 125 MG CAPS Take 120 mg by mouth 2 (two) times daily.    [provider]  montelukast (SINGULAIR) 10 MG tablet Take 1 tablet (10 mg total) by mouth at bedtime. 11/09/20   Dara Hoyer, FNP  triamcinolone cream (KENALOG) 0.1 % Use twice a day if needed to red itchy areas below the face 12/28/19   Charlies Silvers, MD  valACYclovir (VALTREX) 500 MG tablet Take 1 tablet by mouth 2 times daily as directed. 03/12/21   [name], MD     Allergies    Shellfish allergy, Covid-19 mrna vaccine (pfizer) Debbe Odea mrna vacc (moderna)], Nickel, and Prednisone  Review of Systems   Review of Systems  Constitutional:  Negative for chills and fever.  Respiratory:  Negative for cough and shortness of breath.   Cardiovascular:  Positive for chest pain.  Gastrointestinal:  Negative for nausea and vomiting.  Neurological:  Negative for syncope.  All other systems reviewed and are negative.  Physical Exam Updated Vital Signs BP 121/84 (BP Location: Left Arm)   Pulse 68   Temp 98.8 F (37.1 C) (Oral)   Resp 18   Ht 6' (1.829 m)   Wt 80.8 kg   SpO2 100%   BMI 24.17 kg/m   Physical Exam Vitals and nursing note reviewed.  Constitutional:      General: He is not in acute distress.    Appearance: He is well-developed. He is not toxic-appearing.  HENT:     Head: Normocephalic and atraumatic.  Eyes:     General:        Right eye: No discharge.        Left eye: No discharge.     Conjunctiva/sclera: Conjunctivae normal.  Cardiovascular:     Rate and Rhythm: Normal rate and regular rhythm.     Pulses:          Radial pulses are 2+ on the right side and 2+ on the left side.  Pulmonary:     Effort: Pulmonary effort is normal. No respiratory distress.     Breath sounds: Normal breath sounds. No wheezing, rhonchi or rales.  Chest:     Chest wall: No tenderness.  Abdominal:     General: There is no distension.     Palpations: Abdomen is soft.     Tenderness: There is no abdominal tenderness.  Musculoskeletal:     Cervical back: Neck supple.     Right lower leg: No tenderness. No edema.     Left lower leg: No tenderness. No edema.  Skin:    General: Skin is warm and dry.     Findings: No rash.  Neurological:     Mental Status: He is alert.     Comments: Clear speech.   Psychiatric:        Behavior: Behavior normal.    ED Results / Procedures / Treatments   Labs (all labs ordered are listed, but only abnormal  results are displayed) Labs Reviewed  BASIC METABOLIC PANEL - Abnormal; Notable for the following components:      Result Value   Glucose, Bld  133 (*)    All other components within normal limits  CBC  D-DIMER, QUANTITATIVE  TROPONIN I (HIGH SENSITIVITY)  TROPONIN I (HIGH SENSITIVITY)    EKG EKG Interpretation  Date/Time:  Tuesday April 24 2021 18:44:39 EDT Ventricular Rate:  89 PR Interval:  130 QRS Duration: 99 QT Interval:  358 QTC Calculation: 436 R Axis:   75 Text Interpretation: Sinus rhythm Confirmed by Dory Horn) on 04/24/2021 11:50:18 PM  Radiology DG Chest 2 View  Result Date: 04/24/2021 CLINICAL DATA:  Chest pain EXAM: CHEST - 2 VIEW COMPARISON:  04/10/2021 FINDINGS: The heart size and mediastinal contours are within normal limits. No focal pulmonary opacity. No pleural effusion or pneumothorax. The visualized skeletal structures are unremarkable. IMPRESSION: No active cardiopulmonary disease. Electronically Signed   By: Merilyn Baba M.D.   On: 04/24/2021 19:42    Procedures Procedures   Medications Ordered in ED Medications - No data to display  ED Course  I have reviewed the triage vital signs and the nursing notes.  Pertinent labs & imaging results that were available during my care of the patient were reviewed by me and considered in my medical decision making (see chart for details).    MDM Rules/Calculators/A&P                           Patient presents to the emergency department with chest pain. Patient nontoxic appearing, in no apparent distress, vitals without significant abnormality. Fairly benign physical exam.   Additional history obtained:  Additional history obtained from chart review & nursing note review.   EKG: NSR, no STEMI, No ST/T wave changes.   Lab Tests:  I reviewed and interpreted labs, which included:  CBC: Unremarkable.  BMP: Mild hyperglycemia.  Troponin: Flat.  D-dimer WNL.   Imaging Studies ordered:   I ordered imaging studies which included CXR, I independently reviewed, formal radiology impression shows:  No active cardiopulmonary disease.  ED Course:  Heart Pathway Score low risk, EKG without obvious acute ischemia, delta troponin negative, doubt ACS. Patient is low risk wells, d-dimer WNL, low suspicion for pulmonary embolism. Pain is not a tearing sensation, symmetric pulses, no widening of mediastinum on CXR, doubt dissection.  Chest x-ray without infiltrate, pneumothorax, or fluid overload.  Labs reassuring.  Patient has appeared hemodynamically stable throughout ER visit and appears safe for discharge with close PCP/cardiology follow up.  Shared decision-making conversation shared decision-making conversation regarding treatment options, will trial Mobic and Robaxin- discussed no driving/operating heavy machinery with robaxin. I discussed results, treatment plan, need for follow-up, and return precautions with the patient. Provided opportunity for questions, patient confirmed understanding and is in agreement with plan.   Portions of this note were generated with Lobbyist. Dictation errors may occur despite best attempts at proofreading.  Final Clinical Impression(s) / ED Diagnoses Final diagnoses:  Chest pain, unspecified type    Rx / DC Orders ED Discharge Orders          Ordered    methocarbamol (ROBAXIN) 500 MG tablet  Every 8 hours PRN        04/25/21 0423    meloxicam (MOBIC) 15 MG tablet  Daily        04/25/21 0423             Amaryllis Dyke, PA-C 04/25/21 0432    Palumbo, April, MD 04/25/21 0505

## 2021-04-26 ENCOUNTER — Other Ambulatory Visit (HOSPITAL_COMMUNITY): Payer: Self-pay

## 2021-04-26 MED ORDER — METHOCARBAMOL 500 MG PO TABS
500.0000 mg | ORAL_TABLET | Freq: Three times a day (TID) | ORAL | 0 refills | Status: AC | PRN
  Filled 2021-04-26: qty 15, 5d supply, fill #0

## 2021-04-26 MED ORDER — MELOXICAM 15 MG PO TABS
15.0000 mg | ORAL_TABLET | Freq: Every day | ORAL | 0 refills | Status: AC
  Filled 2021-04-26: qty 7, 7d supply, fill #0

## 2021-05-15 ENCOUNTER — Other Ambulatory Visit: Payer: Self-pay | Admitting: Family Medicine

## 2021-05-15 MED ORDER — MONTELUKAST SODIUM 10 MG PO TABS
10.0000 mg | ORAL_TABLET | Freq: Every day | ORAL | 0 refills | Status: AC
  Filled 2021-05-15: qty 90, 90d supply, fill #0

## 2021-05-16 ENCOUNTER — Institutional Professional Consult (permissible substitution): Payer: No Typology Code available for payment source | Admitting: Neurology

## 2021-05-16 ENCOUNTER — Other Ambulatory Visit (HOSPITAL_COMMUNITY): Payer: Self-pay

## 2021-05-28 ENCOUNTER — Institutional Professional Consult (permissible substitution): Payer: No Typology Code available for payment source | Admitting: Neurology

## 2021-10-11 IMAGING — MR MR HEAD W/O CM
11 series · 48 of 48 positions shown · non-contrast
Comparison: None.

CLINICAL DATA: Motor neuron disease. Upper and lower extremity
tingling.

EXAM:
MRI HEAD WITHOUT CONTRAST
TECHNIQUE: Multiplanar, multiecho pulse sequences of the brain and surrounding
structures were obtained without intravenous contrast.

[Series 5: DWI · axial · 3.0mm · 1.36mm/px · z∈[-31,+124]mm · 7 of 108 slices shown (1 of 4)]
[im 1/108]
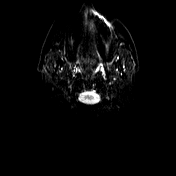
[im 18/108]
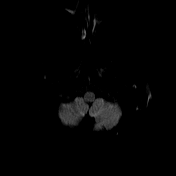
[im 36/108]
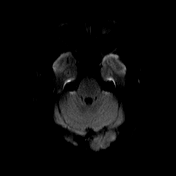
[im 54/108]
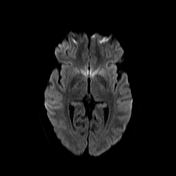
[im 72/108]
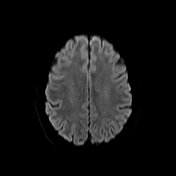
[im 90/108]
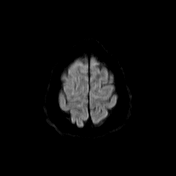
[im 108/108]
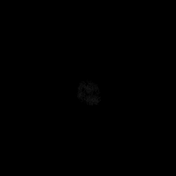

[Series 6: DWI · axial · 3.0mm · 1.36mm/px · z∈[-31,+124]mm · 4 of 54 slices shown (2 of 4)]
[im 1/54]
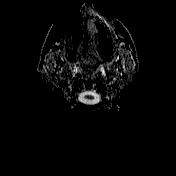
[im 18/54]
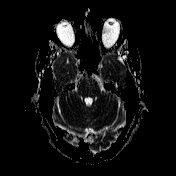
[im 36/54]
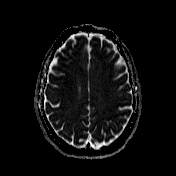
[im 54/54]
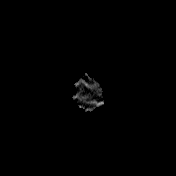

[Series 7: T1 · sagittal · 5.0mm · 0.75mm/px · 2 of 24 slices shown (1 of 2)]
[im 1/24]
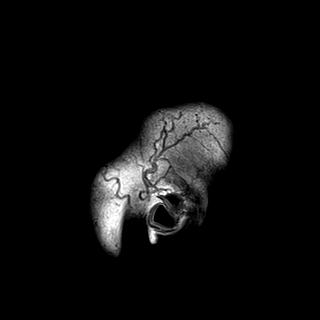
[im 24/24]
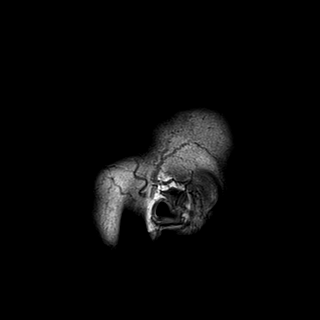

[Series 8: T2 · axial · 5.0mm · 0.60mm/px · z∈[-27,+126]mm · 2 of 25 slices shown (1 of 2)]
[im 1/25]
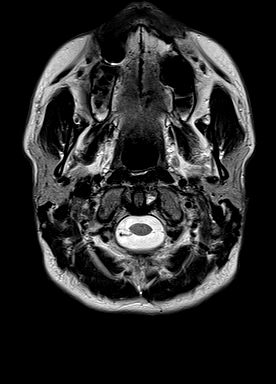
[im 25/25]
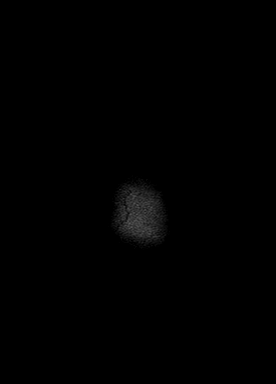

[Series 9: mip_images(sw) · axial · 24.0mm · 0.72mm/px · z∈[-15,+114]mm · 3 of 45 slices shown]
[im 1/45]
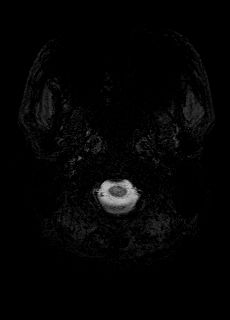
[im 23/45]
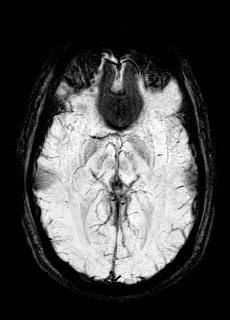
[im 45/45]
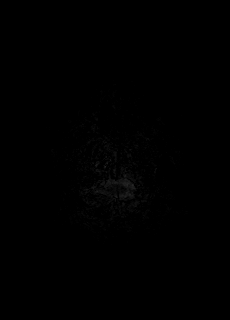

[Series 10: swi_images · axial · 3.0mm · 0.72mm/px · z∈[-25,+124]mm · 4 of 52 slices shown]
[im 1/52]
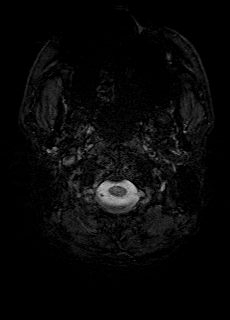
[im 18/52]
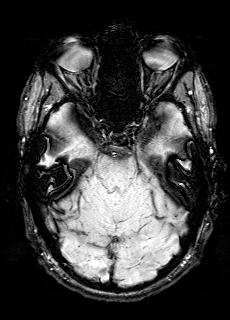
[im 35/52]
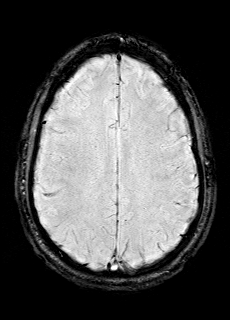
[im 52/52]
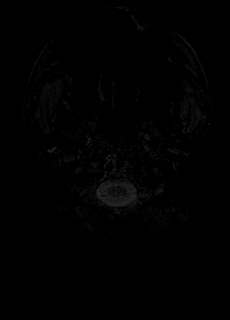

[Series 15: FLAIR · axial · 3.0mm · 0.72mm/px · z∈[-26,+124]mm · 4 of 52 slices shown]
[im 1/52]
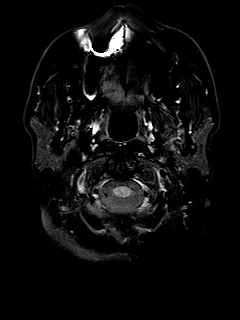
[im 18/52]
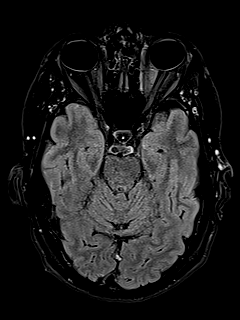
[im 35/52]
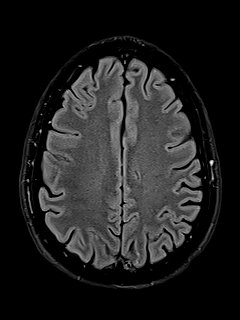
[im 52/52]
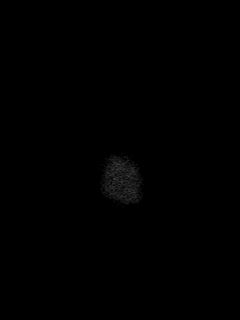

[Series 20: T1 · axial · 1.0mm · 0.90mm/px · z∈[-29,+127]mm · 11 of 160 slices shown (2 of 2)]
[im 1/160]
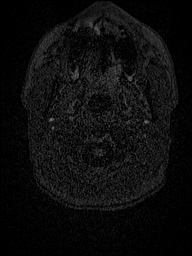
[im 16/160]
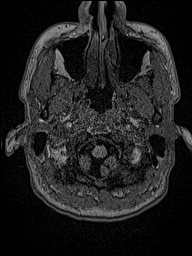
[im 32/160]
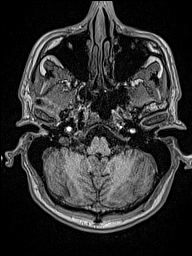
[im 48/160]
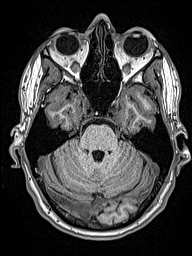
[im 64/160]
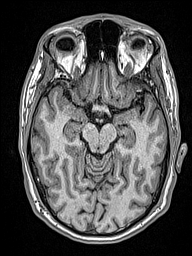
[im 80/160]
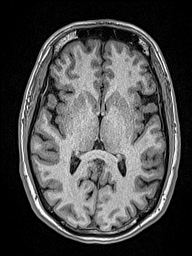
[im 96/160]
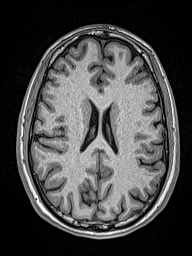
[im 112/160]
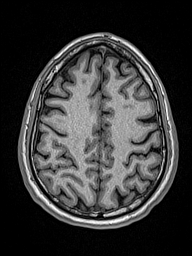
[im 128/160]
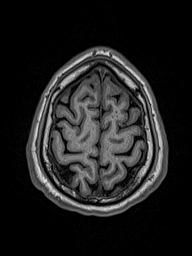
[im 144/160]
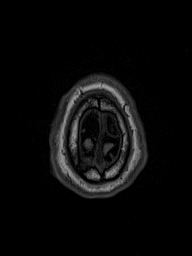
[im 160/160]
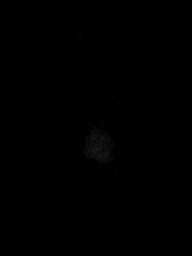

[Series 21: DWI · coronal · 5.0mm · 1.25mm/px · 5 of 76 slices shown (3 of 4)]
[im 1/76]
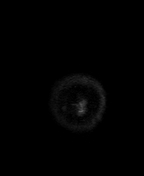
[im 19/76]
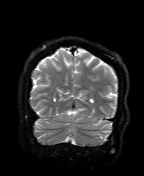
[im 38/76]
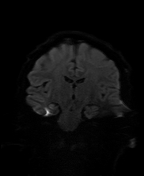
[im 57/76]
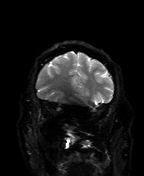
[im 76/76]
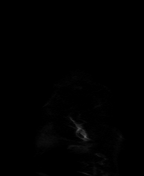

[Series 22: DWI · coronal · 5.0mm · 1.25mm/px · 3 of 38 slices shown (4 of 4)]
[im 1/38]
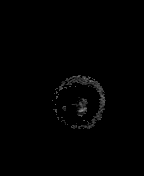
[im 19/38]
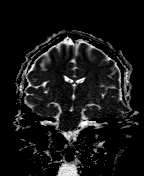
[im 38/38]
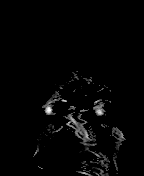

[Series 23: T2 · coronal · 5.0mm · 0.57mm/px · 3 of 38 slices shown (2 of 2)]
[im 1/38]
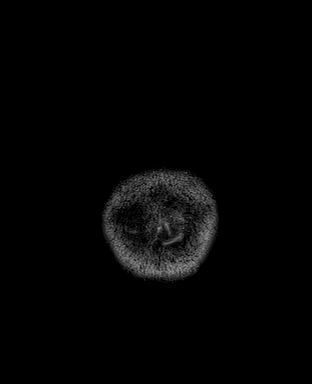
[im 19/38]
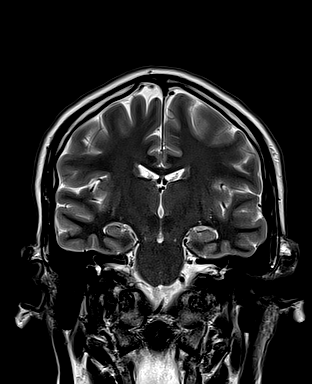
[im 38/38]
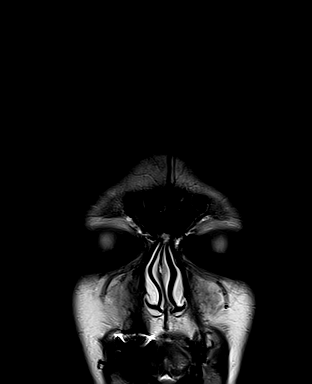

[48 of 48 positions shown; findings below may reference images not displayed]

FINDINGS: Brain: No acute infarction, hemorrhage, hydrocephalus, extra-axial
collection or mass lesion. Normal white matter. Negative for
demyelinating disease.

Vascular: Normal arterial flow voids

Skull and upper cervical spine: No focal skeletal lesion.

Sinuses/Orbits: Paranasal sinuses clear.  Negative orbit

Other: None
IMPRESSION: Normal MRI head without contrast.

## 2021-10-11 IMAGING — MR MR THORACIC SPINE W/O CM
7 series · 36 of 48 positions shown · non-contrast
Comparison: None.

CLINICAL DATA: Motor neuron disease. Bilateral upper and lower
extremity tingling.

EXAM:
MRI THORACIC SPINE WITHOUT CONTRAST
TECHNIQUE: Multiplanar, multisequence MR imaging of the thoracic spine was
performed. No intravenous contrast was administered.

[Series 45: T1 · sagittal · 4.0mm · 1.72mm/px · 3 of 13 slices shown (1 of 3)]
[im 1/13]
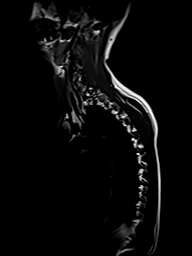
[im 7/13]
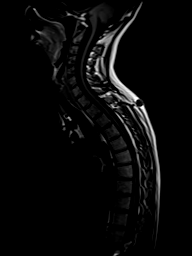
[im 13/13]
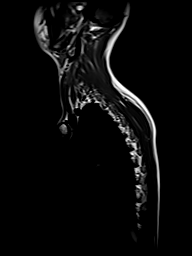

[Series 46: STIR · sagittal · 3.0mm · 1.00mm/px · 5 of 17 slices shown]
[im 1/17]
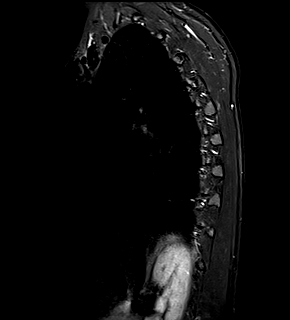
[im 5/17]
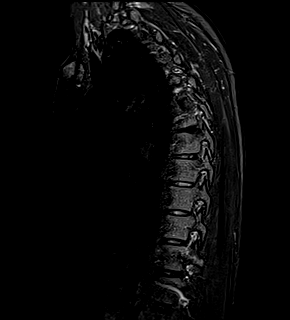
[im 9/17]
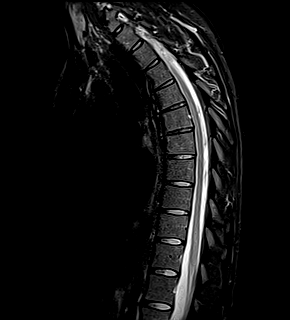
[im 13/17]
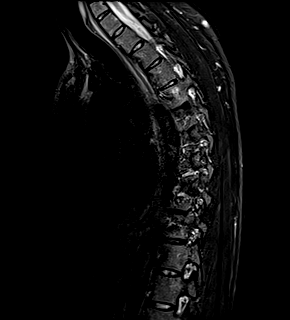
[im 17/17]
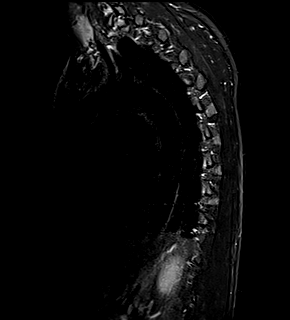

[Series 47: T1 · sagittal · 3.0mm · 1.00mm/px · 5 of 17 slices shown (2 of 3)]
[im 1/17]
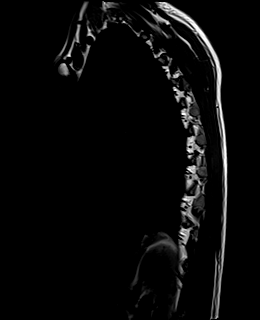
[im 5/17]
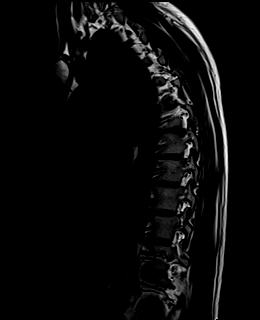
[im 9/17]
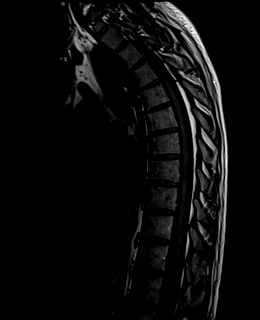
[im 13/17]
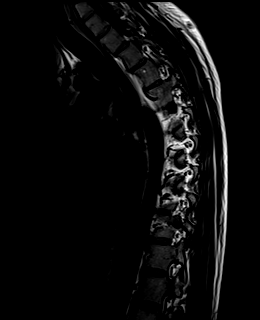
[im 17/17]
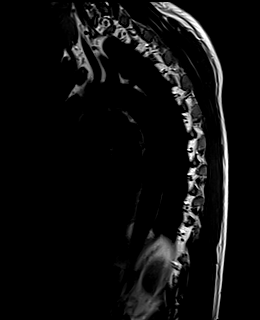

[Series 48: T2 · sagittal · 3.0mm · 0.83mm/px · 5 of 17 slices shown (1 of 2)]
[im 1/17]
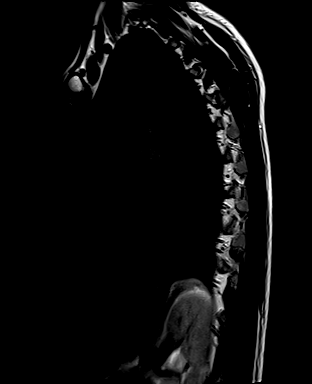
[im 5/17]
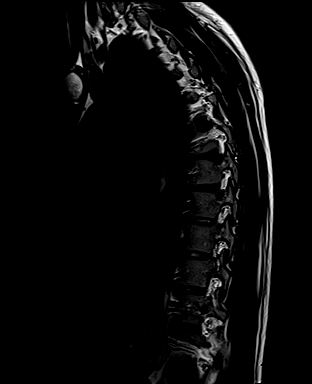
[im 9/17]
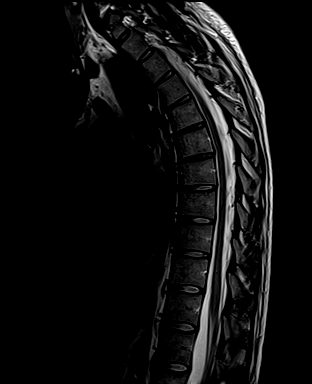
[im 13/17]
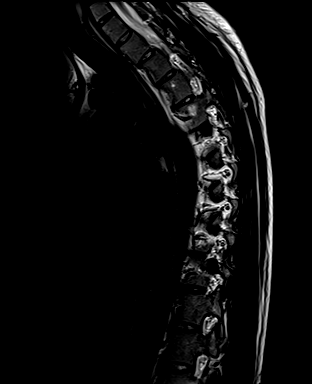
[im 17/17]
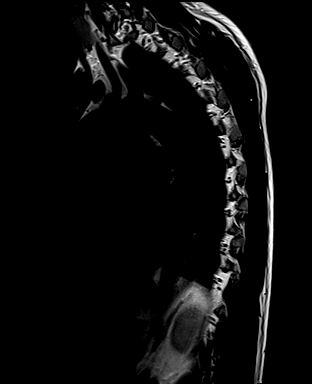

[Series 49: T2 · axial · 4.0mm · 0.78mm/px · z∈[-408,-200]mm · 8 of 37 slices shown (2 of 2)]
[im 1/37]
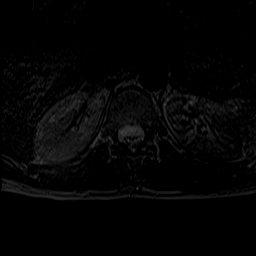
[im 5/37]
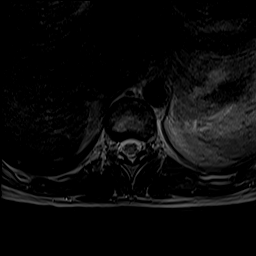
[im 13/37]
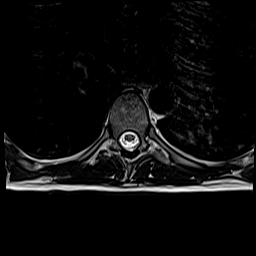
[im 17/37]
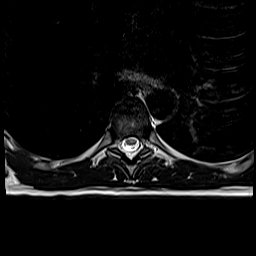
[im 21/37]
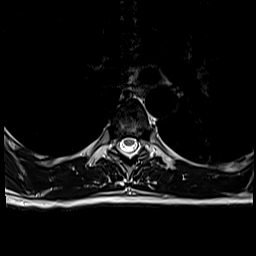
[im 25/37]
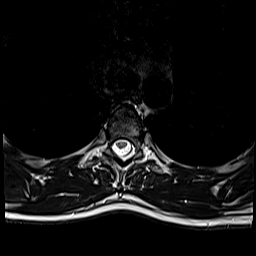
[im 33/37]
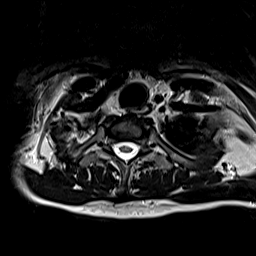
[im 37/37]
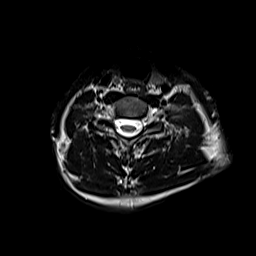

[Series 50: t2_me2d_tra thoracic · axial · 4.0mm · 0.39mm/px · z∈[-408,-370]mm · 2 of 37 slices shown]
[im 1/37]
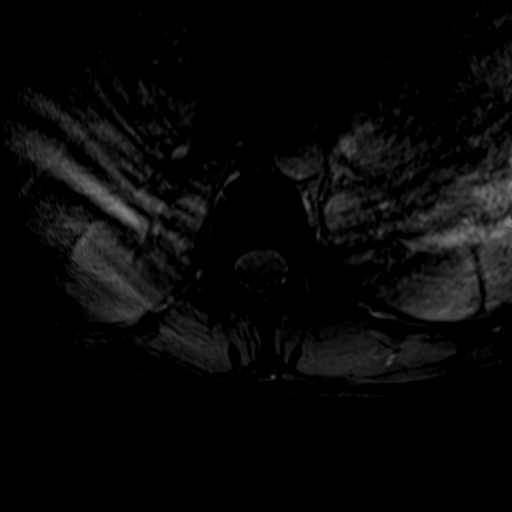
[im 5/37]
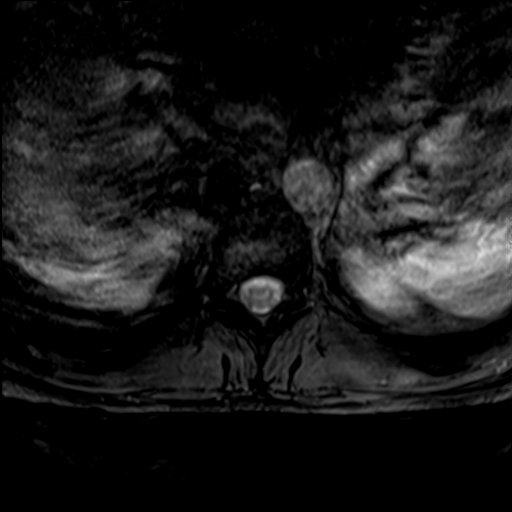

[Series 51: T1 · axial · 4.0mm · 0.39mm/px · z∈[-408,-200]mm · 8 of 37 slices shown (3 of 3)]
[im 1/37]
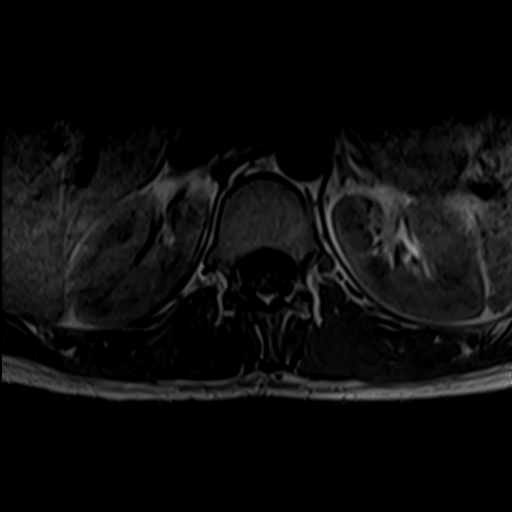
[im 5/37]
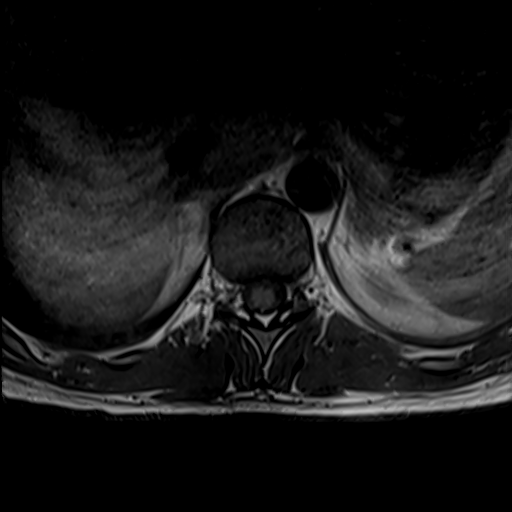
[im 13/37]
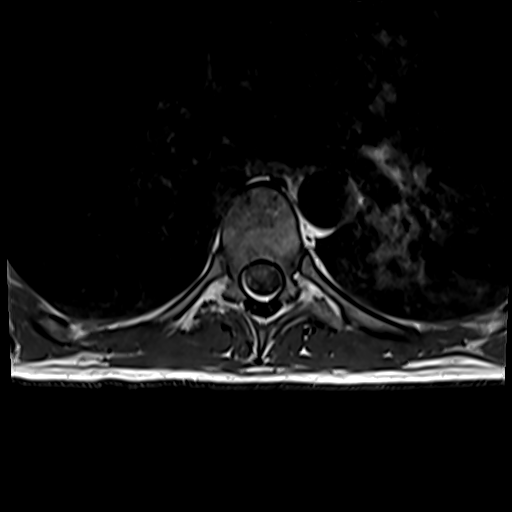
[im 17/37]
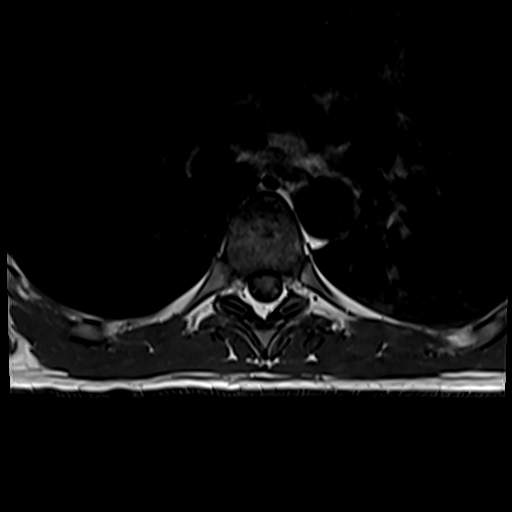
[im 21/37]
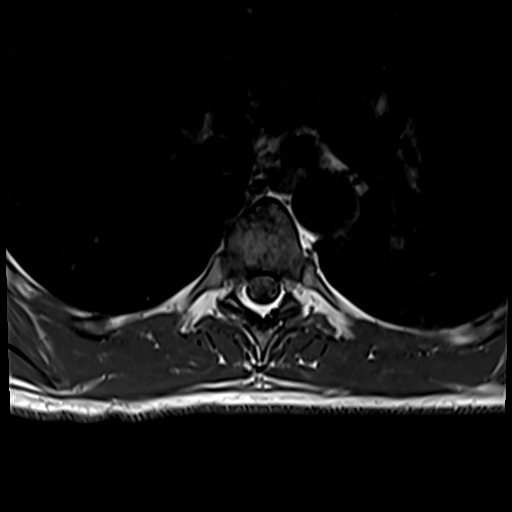
[im 25/37]
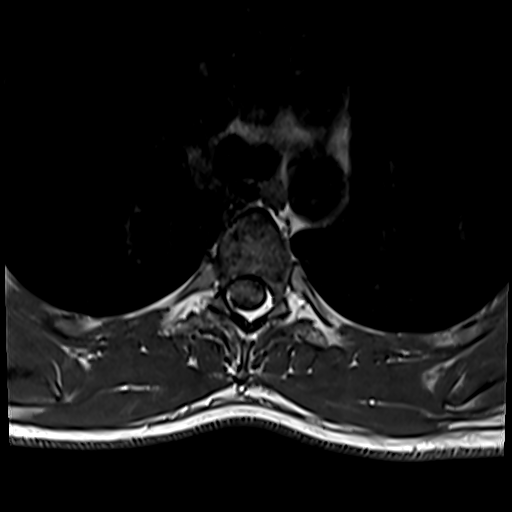
[im 33/37]
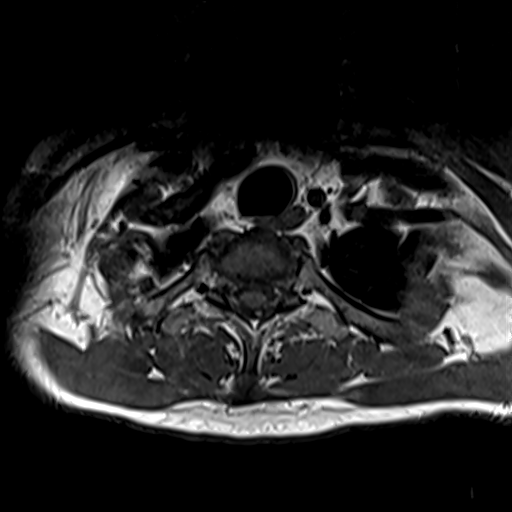
[im 37/37]
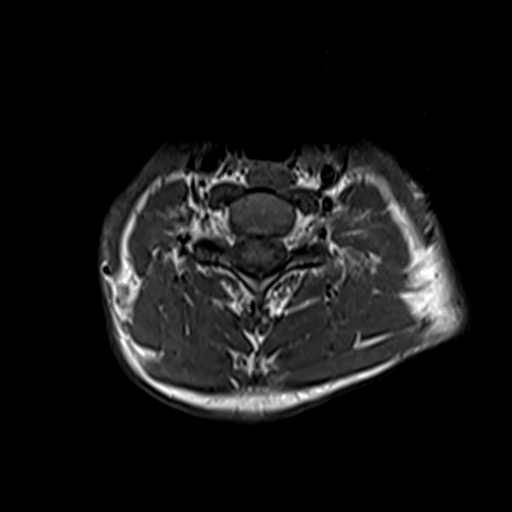

[36 of 48 positions shown; findings below may reference images not displayed]

FINDINGS: Alignment: Mild retrolisthesis T6-7 and T7-8. Mild to moderate
kyphosis.

Vertebrae: Normal bone marrow. Negative for fracture or mass.
Hemangioma T5 vertebral body on the left.

Cord: Normal signal and morphology. No cord compression or cord
lesion.

Paraspinal and other soft tissues: Negative

Disc levels:

Negative for spinal stenosis. Mild disc degeneration and
retrolisthesis T6-7 and T7-8. Mild associated facet degeneration. No
other significant degenerative change. No focal disc protrusion.
IMPRESSION: Mild thoracic degenerative change. Negative for cord compression or
cord lesion.

## 2022-04-29 ENCOUNTER — Encounter: Payer: Self-pay | Admitting: *Deleted

## 2022-07-18 ENCOUNTER — Encounter: Payer: Self-pay | Admitting: *Deleted

## 2023-03-14 NOTE — Telephone Encounter (Signed)
Chart updated and Dr. Jimmey Ralph removed as PCP.
